# Patient Record
Sex: Female | Born: 1940 | Race: White | Hispanic: No | Smoking: Never smoker
Health system: Southern US, Community
[De-identification: ages and names within clinical notes are randomized; demographics above are authoritative.]

## PROBLEM LIST (undated history)

## (undated) DIAGNOSIS — N950 Postmenopausal bleeding: Secondary | ICD-10-CM

## (undated) DIAGNOSIS — S4290XA Fracture of unspecified shoulder girdle, part unspecified, initial encounter for closed fracture: Secondary | ICD-10-CM

## (undated) DIAGNOSIS — N952 Postmenopausal atrophic vaginitis: Secondary | ICD-10-CM

## (undated) DIAGNOSIS — M1990 Unspecified osteoarthritis, unspecified site: Secondary | ICD-10-CM

## (undated) DIAGNOSIS — M8580 Other specified disorders of bone density and structure, unspecified site: Secondary | ICD-10-CM

## (undated) HISTORY — DX: Postmenopausal bleeding: N95.0

## (undated) HISTORY — PX: OTHER SURGICAL HISTORY: SHX169

## (undated) HISTORY — PX: HYSTEROSCOPY: SHX211

## (undated) HISTORY — PX: ANKLE SURGERY: SHX546

## (undated) HISTORY — DX: Fracture of unspecified shoulder girdle, part unspecified, initial encounter for closed fracture: S42.90XA

## (undated) HISTORY — DX: Unspecified osteoarthritis, unspecified site: M19.90

## (undated) HISTORY — DX: Other specified disorders of bone density and structure, unspecified site: M85.80

## (undated) HISTORY — DX: Postmenopausal atrophic vaginitis: N95.2

## (undated) HISTORY — PX: HAND SURGERY: SHX662

---

## 1983-07-27 HISTORY — PX: BREAST BIOPSY: SHX20

## 1998-12-04 ENCOUNTER — Other Ambulatory Visit: Admission: RE | Admit: 1998-12-04 | Discharge: 1998-12-04 | Payer: Self-pay | Admitting: Obstetrics and Gynecology

## 1999-06-15 ENCOUNTER — Emergency Department (HOSPITAL_COMMUNITY): Admission: EM | Admit: 1999-06-15 | Discharge: 1999-06-15 | Payer: Self-pay | Admitting: Emergency Medicine

## 1999-10-27 ENCOUNTER — Encounter: Payer: Self-pay | Admitting: Obstetrics and Gynecology

## 1999-10-27 ENCOUNTER — Encounter: Admission: RE | Admit: 1999-10-27 | Discharge: 1999-10-27 | Payer: Self-pay | Admitting: Obstetrics and Gynecology

## 2000-01-15 ENCOUNTER — Other Ambulatory Visit: Admission: RE | Admit: 2000-01-15 | Discharge: 2000-01-15 | Payer: Self-pay | Admitting: Obstetrics and Gynecology

## 2000-10-31 ENCOUNTER — Encounter: Payer: Self-pay | Admitting: Obstetrics and Gynecology

## 2000-10-31 ENCOUNTER — Encounter: Admission: RE | Admit: 2000-10-31 | Discharge: 2000-10-31 | Payer: Self-pay | Admitting: Obstetrics and Gynecology

## 2001-01-16 ENCOUNTER — Other Ambulatory Visit: Admission: RE | Admit: 2001-01-16 | Discharge: 2001-01-16 | Payer: Self-pay | Admitting: Obstetrics and Gynecology

## 2001-05-19 ENCOUNTER — Ambulatory Visit (HOSPITAL_COMMUNITY): Admission: RE | Admit: 2001-05-19 | Discharge: 2001-05-19 | Payer: Self-pay | Admitting: Obstetrics and Gynecology

## 2001-05-19 ENCOUNTER — Encounter (INDEPENDENT_AMBULATORY_CARE_PROVIDER_SITE_OTHER): Payer: Self-pay | Admitting: Specialist

## 2001-11-01 ENCOUNTER — Encounter: Payer: Self-pay | Admitting: Obstetrics and Gynecology

## 2001-11-01 ENCOUNTER — Encounter: Admission: RE | Admit: 2001-11-01 | Discharge: 2001-11-01 | Payer: Self-pay | Admitting: Obstetrics and Gynecology

## 2002-06-29 ENCOUNTER — Other Ambulatory Visit: Admission: RE | Admit: 2002-06-29 | Discharge: 2002-06-29 | Payer: Self-pay | Admitting: Obstetrics and Gynecology

## 2002-11-23 ENCOUNTER — Encounter: Payer: Self-pay | Admitting: Obstetrics and Gynecology

## 2002-11-23 ENCOUNTER — Encounter: Admission: RE | Admit: 2002-11-23 | Discharge: 2002-11-23 | Payer: Self-pay | Admitting: Obstetrics and Gynecology

## 2003-10-10 ENCOUNTER — Encounter: Admission: RE | Admit: 2003-10-10 | Discharge: 2003-10-10 | Payer: Self-pay | Admitting: Gastroenterology

## 2004-01-09 ENCOUNTER — Encounter: Admission: RE | Admit: 2004-01-09 | Discharge: 2004-01-09 | Payer: Self-pay | Admitting: Obstetrics and Gynecology

## 2004-01-15 ENCOUNTER — Other Ambulatory Visit: Admission: RE | Admit: 2004-01-15 | Discharge: 2004-01-15 | Payer: Self-pay | Admitting: Obstetrics and Gynecology

## 2004-05-20 ENCOUNTER — Ambulatory Visit (HOSPITAL_COMMUNITY): Admission: RE | Admit: 2004-05-20 | Discharge: 2004-05-20 | Payer: Self-pay | Admitting: Orthopedic Surgery

## 2004-05-20 ENCOUNTER — Ambulatory Visit (HOSPITAL_BASED_OUTPATIENT_CLINIC_OR_DEPARTMENT_OTHER): Admission: RE | Admit: 2004-05-20 | Discharge: 2004-05-20 | Payer: Self-pay | Admitting: Orthopedic Surgery

## 2005-01-12 ENCOUNTER — Encounter: Admission: RE | Admit: 2005-01-12 | Discharge: 2005-01-12 | Payer: Self-pay | Admitting: Obstetrics and Gynecology

## 2005-01-19 ENCOUNTER — Other Ambulatory Visit: Admission: RE | Admit: 2005-01-19 | Discharge: 2005-01-19 | Payer: Self-pay | Admitting: Obstetrics and Gynecology

## 2005-09-01 ENCOUNTER — Observation Stay (HOSPITAL_COMMUNITY): Admission: EM | Admit: 2005-09-01 | Discharge: 2005-09-02 | Payer: Self-pay | Admitting: Emergency Medicine

## 2005-11-24 ENCOUNTER — Encounter: Payer: Self-pay | Admitting: Emergency Medicine

## 2006-01-17 ENCOUNTER — Encounter: Admission: RE | Admit: 2006-01-17 | Discharge: 2006-01-17 | Payer: Self-pay | Admitting: Obstetrics and Gynecology

## 2006-01-20 ENCOUNTER — Other Ambulatory Visit: Admission: RE | Admit: 2006-01-20 | Discharge: 2006-01-20 | Payer: Self-pay | Admitting: Obstetrics and Gynecology

## 2007-01-19 ENCOUNTER — Encounter: Admission: RE | Admit: 2007-01-19 | Discharge: 2007-01-19 | Payer: Self-pay | Admitting: Obstetrics and Gynecology

## 2007-02-14 ENCOUNTER — Other Ambulatory Visit: Admission: RE | Admit: 2007-02-14 | Discharge: 2007-02-14 | Payer: Self-pay | Admitting: Obstetrics and Gynecology

## 2007-02-14 ENCOUNTER — Encounter: Admission: RE | Admit: 2007-02-14 | Discharge: 2007-02-14 | Payer: Self-pay | Admitting: Gastroenterology

## 2007-07-13 ENCOUNTER — Ambulatory Visit (HOSPITAL_BASED_OUTPATIENT_CLINIC_OR_DEPARTMENT_OTHER): Admission: RE | Admit: 2007-07-13 | Discharge: 2007-07-13 | Payer: Self-pay | Admitting: Orthopedic Surgery

## 2007-07-27 HISTORY — PX: CATARACT EXTRACTION: SUR2

## 2007-07-27 HISTORY — PX: EYE SURGERY: SHX253

## 2008-01-29 ENCOUNTER — Encounter: Admission: RE | Admit: 2008-01-29 | Discharge: 2008-01-29 | Payer: Self-pay | Admitting: Obstetrics and Gynecology

## 2009-01-31 ENCOUNTER — Encounter: Admission: RE | Admit: 2009-01-31 | Discharge: 2009-01-31 | Payer: Self-pay | Admitting: Obstetrics and Gynecology

## 2009-02-17 ENCOUNTER — Ambulatory Visit: Payer: Self-pay | Admitting: Obstetrics and Gynecology

## 2009-02-20 ENCOUNTER — Ambulatory Visit: Payer: Self-pay | Admitting: Obstetrics and Gynecology

## 2009-02-20 ENCOUNTER — Encounter: Payer: Self-pay | Admitting: Obstetrics and Gynecology

## 2009-02-20 ENCOUNTER — Other Ambulatory Visit: Admission: RE | Admit: 2009-02-20 | Discharge: 2009-02-20 | Payer: Self-pay | Admitting: Obstetrics and Gynecology

## 2009-07-26 HISTORY — PX: TOTAL ANKLE REPLACEMENT: SUR1218

## 2010-01-06 ENCOUNTER — Ambulatory Visit (HOSPITAL_COMMUNITY): Admission: RE | Admit: 2010-01-06 | Discharge: 2010-01-07 | Payer: Self-pay | Admitting: Orthopedic Surgery

## 2010-03-05 ENCOUNTER — Encounter: Admission: RE | Admit: 2010-03-05 | Discharge: 2010-03-05 | Payer: Self-pay | Admitting: Obstetrics and Gynecology

## 2010-03-17 ENCOUNTER — Other Ambulatory Visit: Admission: RE | Admit: 2010-03-17 | Discharge: 2010-03-17 | Payer: Self-pay | Admitting: Obstetrics and Gynecology

## 2010-03-17 ENCOUNTER — Ambulatory Visit: Payer: Self-pay | Admitting: Obstetrics and Gynecology

## 2010-08-16 ENCOUNTER — Encounter: Payer: Self-pay | Admitting: Obstetrics and Gynecology

## 2010-10-11 LAB — PROTIME-INR: INR: 1.1 (ref 0.00–1.49)

## 2010-10-12 LAB — CBC
MCHC: 34.6 g/dL (ref 30.0–36.0)
MCV: 90.4 fL (ref 78.0–100.0)
WBC: 5.8 10*3/uL (ref 4.0–10.5)

## 2010-12-08 NOTE — Op Note (Signed)
NAMEKASEY, HANSELL               ACCOUNT NO.:  [id number]   MEDICAL RECORD NO.:  [id number]          PATIENT TYPE:  AMB   LOCATION:  DSC                          FACILITY:  [facility]   PHYSICIAN:  Rodney A. Mortenson, M.D.DATE OF BIRTH:  Apr 04, 1941   DATE OF PROCEDURE:  07/13/2007  DATE OF DISCHARGE:                               OPERATIVE REPORT   PREOPERATIVE DIAGNOSIS:  Painful hardware left ankle, secondary to old  open reduction and internal fixation bimalleolar fracture left ankle.   POSTOP DIAGNOSIS:  Painful hardware left ankle, secondary to old open  reduction and internal fixation bimalleolar fracture left ankle.   OPERATION:  Removal of screws from medial malleolus, left ankle.   SURGEON:  Lenard Galloway. Chaney Malling, M.D.   ANESTHESIA:  General.   PROCEDURE:  The patient placed on the operating table in the supine  position with the pneumatic tourniquet brought about the left upper  thigh.  The entire left lower extremity was prepped with DuraPrep, and  draped out in the usual manner.  The leg was wrapped out with Esmarch.  Tourniquet was not used initially.   Incision made over the medial malleolus, and the area of the previous  incision.  C-arm was used throughout to localize the two screws.  Dissection carried down to the medial malleolus.  Needles were inserted,  and the C-arm was used extensively.  The first screw was identified and  isolated.  Soft tissue was stripped off.  There was some hypertrophic  bone around the screw, and the screw was removed, and the excess bony  protuberance was debrided with a rongeur, and it was smoothed off very  nicely.  Bleeders were coagulated throughout procedure.   In a similar fashion, attention was turned to the second screw which was  more proximal and posterior.  Needles were used to localize the site of  the screw using the C-arm.  This screw was more proximal and posterior,  and dissection was carried down to the area of the  posterior tibial  tendon.  The screw was identified and isolated.  It appears as though  this was very close to the posterior tibial tendon, and it may have been  causing some slight discomfort; but the tendon, itself, appeared normal.  Some of bony hypertrophy was debrided in this area so there was access  to the screw.  The screw was then removed.   At this point, the ankle was irrigated with copious amounts of saline  solution.  During the procedure, the Esmarch became ineffective and  tourniquet was used.  At this point, the tourniquet was dropped and  bleeders were coagulated.  One vein was tied off with 2-0 Vicryl.  There  was excellent motion of the ankle and subtalar joint.  Skin was then  closed with interrupted stainless steel staples.  Marcaine was placed  about the wound.  A large bulky pressure dressing was applied, and the  patient returned to the recovery room in excellent condition.  The  techniques went extremely well.  It should be noted, there was  hypertrophic bone above the  screw heads both anteriorly and posteriorly,  and this may have been causing the pain and discomfort she was  experiencing.   DISPOSITION:  1. To my office Monday.  2. Vicodin for pain.  3. Touchdown weightbearing with crutches.  4. Usual postop instructions were given.      Rodney A. Chaney Malling, M.D.  Electronically Signed     RAM/MEDQ  D:  07/13/2007  T:  07/14/2007  Job:  [id number]

## 2010-12-11 NOTE — H&P (Signed)
NAMEMIRIAM, Courtney Nash               ACCOUNT NO.:  [id number]   MEDICAL RECORD NO.:  [id number]          PATIENT TYPE:  AMB   LOCATION:  DSC                          FACILITY:  [facility]   PHYSICIAN:  Artist Pais. Weingold, M.D.DATE OF BIRTH:  Feb 15, 1941   DATE OF ADMISSION:  05/20/2004  DATE OF DISCHARGE:                                HISTORY & PHYSICAL   PREOPERATIVE DIAGNOSIS:  Right thumb stenosing tenosynovitis.   POSTOPERATIVE DIAGNOSIS:  Right thumb stenosing tenosynovitis.   PROCEDURE:  Right thumb A1 pulley release.   SURGEON:  Dr. Mina Marble.   ASSISTANT:  Nurse.   ANESTHESIA:  Monitored anesthesia care with 2% plain lidocaine, 0.25%  Marcaine digitally to achieve block.   TOURNIQUET TIME:  12 minutes.   COMPLICATIONS:  None.   DRAINS:  None.   OPERATIVE REPORT:  The patient was taken to the operating room after the  induction of adequate IV sedation.  The right upper extremity was prepped  and draped in the usual sterile fashion.  An Esmarch was used to  exsanguinate the limb.  The tourniquet was inflated to 250 mmHg.  At this  point in time, 3 cc of a combination of 0.25% plain Marcaine and 2%  lidocaine was injected in the A1 pulley area of the right thumb.  Once  anesthesia was obtained, a transverse incision was made.  The neurovascular  bundles were identified and retracted.  The A1 pulley was split with a 15  blade.  The FPL tendon was lysed of all adhesions.  It was irrigated and  loosely closed with 5-0 nylon.  A sterile dressing, Xeroform, 4 x 4's and a  compressive bandage was applied.  The patient tolerated the procedure well  and went to the recovery room in stable fashion.       MAW/MEDQ  D:  05/20/2004  T:  05/20/2004  Job:  [id number]

## 2010-12-11 NOTE — Op Note (Signed)
[facility]  Patient:    Courtney Nash, Courtney Nash Visit Number: [id number] MRN: [id number]          Service Type: Attending:  Rande Brunt. Eda Paschal, M.D. Proc. Date: 05/19/01                             Operative Report  PREOPERATIVE DIAGNOSES:  Postmenopausal bleeding with enlarged endometrial cavity.  POSTOPERATIVE DIAGNOSES:  Postmenopausal bleeding with enlarged endometrial cavity.  OPERATION:  Dilation and curettage hysteroscopy.  SURGEON:  Daniel L. Eda Paschal, M.D.  ANESTHESIA:  General.  INDICATIONS:  The patient is a 70 year old female, who had inappropriate postmenopausal bleeding.  Ultrasound was done in the office which showed an enlarged endometrial cavity with an enlarged endometrial stripe without specific pathology, but it was enlarged, and there was some fluid in the cavity.  Because of a stenotic cervix and the patients reluctance to have sampling done in the office because of the discomfort associated with it, she is brought to the operating room for hysteroscopy D&C so that proper endometrial sampling can be obtained, and a hysteroscopic examination can be done looking for intrauterine pathology.  FINDINGS:  External and vaginal is within normal limits.  Cervix is clean but stenotic.  Uterus is anteverted and normal size and shape.  Adnexa are not palpable.  There are no adnexal masses.  At the time of hysteroscopy, the patient had a normal endometrial cavity.  It was atrophic without any evidence of pathology whatsoever.  When endometrial sampling was obtained, almost no tissue could be obtained because the lining was atrophic.  DESCRIPTION OF PROCEDURE:  After adequate general endotracheal anesthesia, the patient was placed in a dorsal supine position and prepped and draped in the usual sterile manner.  A single-tooth tenaculum was placed in the anterior lip of the cervix.  Taking extreme care to dilate her because of the  atrophic condition, she was uneventfully dilated to a #23 Pratt dilator and then a hysteroscopic examination was done with the observatory hysteroscope.  It was attached to a camera for magnification and 3% sorbitol was used to expand the intrauterine cavity.  When the uterus was entered, although care had been taken with the dilatation, she had a very small perforation at the top of the fundus in the midline that was not bleeding.  The hysteroscope could be, however, placed at the top of the cervix.  The intrauterine cavity could be expanded so that the entire intrauterine cavity could be observed, and there was no pathology noted.  It was also safe to take endometrial samplings without concern that the curette was leaving the uterus.  At the termination of the procedure, there was no bleeding noted.   She was re-hysteroscoped. The perforation actually had closed.  Fluid deficit was only 280 cc.  The patient tolerated the procedure well and left the operating room in satisfactory condition. Attending:  Rande Brunt. Eda Paschal, M.D. DD:  05/19/01 TD:  05/20/01 Job: [id number]

## 2010-12-11 NOTE — Op Note (Signed)
Courtney Nash, Courtney Nash               ACCOUNT NO.:  [id number]   MEDICAL RECORD NO.:  [id number]          PATIENT TYPE:  INP   LOCATION:  5030                         FACILITY:  [facility]   PHYSICIAN:  Lenard Galloway. Mortenson, M.D.DATE OF BIRTH:  05/24/1941   DATE OF PROCEDURE:  09/01/2005  DATE OF DISCHARGE:                                 OPERATIVE REPORT   PREOPERATIVE DIAGNOSIS:  Comminuted fracture, left fibula; comminuted two-  part fracture medial malleolus, left ankle.   POSTOPERATIVE DIAGNOSIS:  Comminuted fracture, left fibula; comminuted two-  part fracture medial malleolus, left ankle.   OPERATION PERFORMED:  Open reduction fixation using 7-hole lateral side  plate on fibula; two cannulated screws two-part fracture medial malleolus,  left ankle.   SURGEON:  Lenard Galloway. Chaney Malling, M.D.   ASSISTANT:  Richardean Canal, P.A.   ANESTHESIA:  General.   DESCRIPTION OF PROCEDURE:  Patient placed on the operating table in supine  position with a pneumatic tourniquet about the left upper thigh.  Left lower  extremity was prepped with DuraPrep and draped out in the usual manner.  The  leg was wrapped out with an Esmarch.  The tourniquet was elevated.  Incision  made over the fibula.  This was carried proximal to the fracture and down to  the tip of the lateral malleolus.  Skin edges were retracted.  Incision was  carried down to the bony elements.  Care was taken to avoid injury to the  superficial cutaneous nerves.  The fracture was seen.  There was a  comminuted fracture part with a large butterfly fragment posteriorly.  Blood  was removed and wound was irrigated.  The fracture was reduced almost  anatomically and butterfly fragments put back in an anatomic position.  A 7-  hole lateral side plate was selected and placed over the fracture and held  in place with clamps.  This reduced the fracture anatomically on the lateral  side.  Drill holes were made and measured and appropriate length  cortical  screws were used.  Distally locking plate fixation screws were used.  An  excellent reduction was achieved about the fibula.  Attention was turned to  the medial malleolus.  An incision was made proximal to the fracture site  and carried down to the tip of the medial malleolus.  Skin edges were  retracted.  The fracture was interesting in that it was a T-type fracture  with two parts to the medial malleolus and anterior and posterior fragment.  Some loose pieces of bone were then removed.  Each fragment was then reduced  into an anatomic position. Fixation pins were inserted.  Guide pins were  passed up through the anterior fragment, posterior fragment, checked with  the C-arm.  The C-arm was used throughout the entire operative procedure and  reduction and fixation with screws.  The screw length was measured and  appropriate length cancellous screws were passed  over the guide pins and  both anterior and posterior malleolar fragments were held in excellent  position and stabilized very nicely.  Wound was irrigated with saline  solution.  Vicryl was used to close the subcutaneous tissue.  Stainless  steel staples used to close the skin.  Sterile dressings were applied and a  sugar tong cast applied.  The patient returned to the recovery room in  excellent condition. Technically, this procedure went extremely well.  The  patient is to be nonweightbearing.   DRAINS:  None.   COMPLICATIONS:  None.   I was very pleased with the surgical outcome.           ______________________________  Lenard Galloway. Chaney Malling, M.D.     RAM/MEDQ  D:  09/01/2005  T:  09/02/2005  Job:  [id number]

## 2011-01-20 ENCOUNTER — Other Ambulatory Visit: Payer: Self-pay | Admitting: Obstetrics and Gynecology

## 2011-01-20 DIAGNOSIS — Z1231 Encounter for screening mammogram for malignant neoplasm of breast: Secondary | ICD-10-CM

## 2011-03-08 ENCOUNTER — Ambulatory Visit
Admission: RE | Admit: 2011-03-08 | Discharge: 2011-03-08 | Disposition: A | Discharge status: Home / Self Care | Payer: PRIVATE HEALTH INSURANCE | Source: Ambulatory Visit | Attending: Obstetrics and Gynecology | Admitting: Obstetrics and Gynecology

## 2011-03-08 DIAGNOSIS — Z1231 Encounter for screening mammogram for malignant neoplasm of breast: Secondary | ICD-10-CM

## 2011-03-19 ENCOUNTER — Encounter: Payer: Self-pay | Admitting: Obstetrics and Gynecology

## 2011-03-25 ENCOUNTER — Ambulatory Visit (INDEPENDENT_AMBULATORY_CARE_PROVIDER_SITE_OTHER): Payer: PRIVATE HEALTH INSURANCE | Admitting: Gynecology

## 2011-03-25 ENCOUNTER — Other Ambulatory Visit: Payer: Self-pay

## 2011-03-25 DIAGNOSIS — M899 Disorder of bone, unspecified: Secondary | ICD-10-CM

## 2011-03-25 DIAGNOSIS — M949 Disorder of cartilage, unspecified: Secondary | ICD-10-CM

## 2011-04-05 ENCOUNTER — Encounter: Payer: Self-pay | Admitting: Obstetrics and Gynecology

## 2011-04-05 ENCOUNTER — Ambulatory Visit (INDEPENDENT_AMBULATORY_CARE_PROVIDER_SITE_OTHER): Payer: PRIVATE HEALTH INSURANCE | Admitting: Obstetrics and Gynecology

## 2011-04-05 VITALS — BP 120/64 | Ht 66.0 in | Wt 162.0 lb

## 2011-04-05 DIAGNOSIS — R82998 Other abnormal findings in urine: Secondary | ICD-10-CM

## 2011-04-05 DIAGNOSIS — R350 Frequency of micturition: Secondary | ICD-10-CM

## 2011-04-05 DIAGNOSIS — R3915 Urgency of urination: Secondary | ICD-10-CM

## 2011-04-05 DIAGNOSIS — M8580 Other specified disorders of bone density and structure, unspecified site: Secondary | ICD-10-CM | POA: Insufficient documentation

## 2011-04-05 DIAGNOSIS — M949 Disorder of cartilage, unspecified: Secondary | ICD-10-CM

## 2011-04-05 DIAGNOSIS — N950 Postmenopausal bleeding: Secondary | ICD-10-CM | POA: Insufficient documentation

## 2011-04-05 DIAGNOSIS — N952 Postmenopausal atrophic vaginitis: Secondary | ICD-10-CM

## 2011-04-05 NOTE — Progress Notes (Signed)
Subjective:     Patient ID: Courtney Nash, female   DOB: 12/18/1940, 70 y.o.   MRN: [id number]  HPIpatient came back to see me today for followup. We first discussed her bone density. She does have osteopenia with one hip getting thinner. She is however a low threshold for treatment. She had a bad  fall this year and did not fracture anything. She is taking her calcium and D. Her hot flashes have resolved. She does have vaginal dryness but is not having intercourse. She is having no vaginal bleeding. She does have nocturia 2-3 times per night as well as some urgency. She does not feel it's at that point to take either medication or vaginal estrogen.   Review of Systems  Constitutional: Negative.   Eyes: Negative.   Respiratory: Negative.   Cardiovascular: Negative.   Gastrointestinal:       Gerd  Genitourinary: Positive for urgency and frequency.  Musculoskeletal: Negative.   Neurological: Positive for headaches.  Hematological: Negative.   Psychiatric/Behavioral: Negative.        Objective:   Physical ExamPhysical examination: HEENT within normal limits. Neck: Thyroid not large. No masses. Supraclavicular nodes: not enlarged. Breasts: Examined in both sitting midline position. No skin changes and no masses. Abdomen: Soft no guarding rebound or masses or hernia. Pelvic: External: Within normal limits. BUS: Within normal limits. Vaginal:within normal limits. poor estrogen effect. No evidence of  rectocele or enterocele small cystocele. Cervix: clean. Uterus: Normal size and shape. Adnexa: No masses. Rectovaginal exam: Confirmatory and negative. Extremities: Within normal limits.     Assessment:     1. Atrophic vaginitis 2. Osteopenia 3. Cystocele 4. Urgency and frequency    Plan:    discussed treatment for all the above. For the moment we'll just watch expectantly.

## 2011-04-05 NOTE — Progress Notes (Signed)
Addended byCammie Mcgee T on: 04/05/2011 11:38 AM   Modules accepted: Orders

## 2011-04-13 ENCOUNTER — Other Ambulatory Visit: Payer: Self-pay

## 2011-04-13 DIAGNOSIS — N390 Urinary tract infection, site not specified: Secondary | ICD-10-CM

## 2011-04-13 MED ORDER — NITROFURANTOIN MONOHYD MACRO 100 MG PO CAPS: 100.0000 mg | ORAL_CAPSULE | Freq: Two times a day (BID) | ORAL | Status: AC

## 2011-04-14 ENCOUNTER — Encounter: Payer: Self-pay | Admitting: Obstetrics and Gynecology

## 2011-04-30 LAB — POCT HEMOGLOBIN-HEMACUE
Hemoglobin: 13.3
Operator id: 123881

## 2011-05-04 ENCOUNTER — Ambulatory Visit (INDEPENDENT_AMBULATORY_CARE_PROVIDER_SITE_OTHER): Payer: PRIVATE HEALTH INSURANCE | Admitting: *Deleted

## 2011-05-04 DIAGNOSIS — N390 Urinary tract infection, site not specified: Secondary | ICD-10-CM

## 2011-05-05 MED ORDER — CIPROFLOXACIN HCL 250 MG PO TABS: 250.0000 mg | ORAL_TABLET | Freq: Two times a day (BID) | ORAL | Status: AC

## 2011-05-18 ENCOUNTER — Other Ambulatory Visit: Payer: PRIVATE HEALTH INSURANCE | Admitting: *Deleted

## 2011-05-18 DIAGNOSIS — N390 Urinary tract infection, site not specified: Secondary | ICD-10-CM

## 2011-11-15 ENCOUNTER — Other Ambulatory Visit: Payer: Self-pay | Admitting: Gastroenterology

## 2011-12-27 ENCOUNTER — Ambulatory Visit (INDEPENDENT_AMBULATORY_CARE_PROVIDER_SITE_OTHER): Payer: PRIVATE HEALTH INSURANCE | Admitting: Obstetrics and Gynecology

## 2011-12-27 DIAGNOSIS — Z780 Asymptomatic menopausal state: Secondary | ICD-10-CM

## 2011-12-27 DIAGNOSIS — R300 Dysuria: Secondary | ICD-10-CM

## 2011-12-27 LAB — URINALYSIS W MICROSCOPIC + REFLEX CULTURE
Casts: NONE SEEN
Glucose, UA: 250 mg/dL — AB
Protein, ur: >300 mg/dL — AB
pH: 5 (ref 5.0–8.0)

## 2011-12-27 MED ORDER — NITROFURANTOIN MONOHYD MACRO 100 MG PO CAPS: 100.0000 mg | ORAL_CAPSULE | Freq: Two times a day (BID) | ORAL | Status: AC

## 2011-12-27 NOTE — Progress Notes (Signed)
Patient came to see me today with a five-day history of dysuria, frequency, urgency, inability to completely empty her bladder. She has been using Azo-Standard with partial relief. Her urinalysis today was abnormal. She also wanted to discuss that her internist switched her from Serzone to Depakote for treatment of her depression because he said that Serzone can cause liver damage. So far her liver function has been normal. She says her headaches are better on the Depakote but she is now having more hot flashes.  Assessment: #1. Urinary tract infection #2. Increasing hot flashes  Plan: Macrobid twice a day with food for 7 days. Followup urinalysis next week. Discussed her considering going back on Serzone. She will discuss with PCP. I told her I would give her estrogen although we tried to avoid starting it in someone at 36. We also discussed Effexor and Pristiq.

## 2011-12-30 LAB — URINE CULTURE: Colony Count: >=100000

## 2012-01-04 ENCOUNTER — Other Ambulatory Visit: Payer: Self-pay | Admitting: Obstetrics and Gynecology

## 2012-01-04 ENCOUNTER — Ambulatory Visit (INDEPENDENT_AMBULATORY_CARE_PROVIDER_SITE_OTHER): Payer: PRIVATE HEALTH INSURANCE | Admitting: Obstetrics and Gynecology

## 2012-01-04 DIAGNOSIS — R300 Dysuria: Secondary | ICD-10-CM

## 2012-01-04 DIAGNOSIS — R3129 Other microscopic hematuria: Secondary | ICD-10-CM

## 2012-01-04 LAB — URINALYSIS W MICROSCOPIC + REFLEX CULTURE
Bilirubin Urine: NEGATIVE
Glucose, UA: NEGATIVE mg/dL
Ketones, ur: NEGATIVE mg/dL
Protein, ur: NEGATIVE mg/dL
Urobilinogen, UA: 0.2 mg/dL (ref 0.0–1.0)

## 2012-01-04 NOTE — Patient Instructions (Signed)
Discuss with Dr. Neale Burly either Pristiq or effexor for hot flashes.

## 2012-01-04 NOTE — Progress Notes (Signed)
Patient came back today for a followup visit after being treated for urinary tract infection last week. Her culture grew out both Klebsiella and Escherichia coli. They were both sensitive to Macrodantin although one showed intermediate sensitivity. She is completely asymptomatic today after finishing her antibiotic. On urinalysis she still has 3-6 red blood cells.  Assessment: #1. Urinary tract infection #2. Microscopic hematuria #3. Hot flashes  Plan: We will culture her urine. We will obviously treat any infection. If there is no infection she will return in 3 weeks for followup urinalysis due to red blood cells. She has never been evaluated for microscopic hematuria. She will discuss SSRIs for hot flashes with Dr. Neale Burly.

## 2012-01-06 ENCOUNTER — Other Ambulatory Visit: Payer: Self-pay | Admitting: *Deleted

## 2012-01-06 DIAGNOSIS — R718 Other abnormality of red blood cells: Secondary | ICD-10-CM

## 2012-01-06 LAB — URINE CULTURE: Organism ID, Bacteria: NO GROWTH

## 2012-01-28 ENCOUNTER — Other Ambulatory Visit: Payer: PRIVATE HEALTH INSURANCE

## 2012-01-28 DIAGNOSIS — R718 Other abnormality of red blood cells: Secondary | ICD-10-CM

## 2012-01-29 LAB — URINALYSIS W MICROSCOPIC + REFLEX CULTURE
Casts: NONE SEEN
Hgb urine dipstick: NEGATIVE
Ketones, ur: NEGATIVE mg/dL
Nitrite: NEGATIVE
Protein, ur: NEGATIVE mg/dL
pH: 7.5 (ref 5.0–8.0)

## 2012-01-31 LAB — URINE CULTURE: Colony Count: >=100000

## 2012-02-01 ENCOUNTER — Telehealth: Payer: Self-pay | Admitting: Obstetrics and Gynecology

## 2012-02-01 DIAGNOSIS — N390 Urinary tract infection, site not specified: Secondary | ICD-10-CM

## 2012-02-01 MED ORDER — TERCONAZOLE 0.8 % VA CREA: 1.0000 | TOPICAL_CREAM | Freq: Every day | VAGINAL | Status: AC

## 2012-02-01 MED ORDER — CIPROFLOXACIN HCL 500 MG PO TABS: 500.0000 mg | ORAL_TABLET | Freq: Two times a day (BID) | ORAL | Status: AC

## 2012-02-01 NOTE — Telephone Encounter (Signed)
Patient's saw me in June with a urinary tract infection. She was treated with Macrobid and urine culture became negative. However on both the first and second urines there was microscopic hematuria. We asked her to return again for followup urine with urological referral if hematuria persisted. This urine did not show hematuria but she now had another infection with greater than 100,000 colonies of Escherichia coli. I called her today and discussed  This. She was treated with Cipro 500 mg twice a day for 7 days and then she will return for followup urinalysis. I also called her in  terconazole 3 cream because she is now itching vaginally.

## 2012-02-04 ENCOUNTER — Other Ambulatory Visit: Payer: Self-pay | Admitting: Obstetrics and Gynecology

## 2012-02-04 DIAGNOSIS — Z1231 Encounter for screening mammogram for malignant neoplasm of breast: Secondary | ICD-10-CM

## 2012-02-10 ENCOUNTER — Other Ambulatory Visit: Payer: PRIVATE HEALTH INSURANCE

## 2012-02-10 DIAGNOSIS — N390 Urinary tract infection, site not specified: Secondary | ICD-10-CM

## 2012-02-11 LAB — URINALYSIS W MICROSCOPIC + REFLEX CULTURE
Bacteria, UA: NONE SEEN
Hgb urine dipstick: NEGATIVE
Ketones, ur: NEGATIVE mg/dL
Leukocytes, UA: NEGATIVE
Nitrite: NEGATIVE
Specific Gravity, Urine: 1.016 (ref 1.005–1.030)
Urobilinogen, UA: 0.2 mg/dL (ref 0.0–1.0)

## 2012-02-16 ENCOUNTER — Telehealth: Payer: Self-pay | Admitting: *Deleted

## 2012-02-16 NOTE — Telephone Encounter (Signed)
PT INFORMED WITH NEGATIVE RECENT 02/10/12  URINE CULTURE RESULTS.

## 2012-03-09 ENCOUNTER — Ambulatory Visit
Admission: RE | Admit: 2012-03-09 | Discharge: 2012-03-09 | Disposition: A | Discharge status: Home / Self Care | Payer: PRIVATE HEALTH INSURANCE | Source: Ambulatory Visit | Attending: Obstetrics and Gynecology | Admitting: Obstetrics and Gynecology

## 2012-03-09 DIAGNOSIS — Z1231 Encounter for screening mammogram for malignant neoplasm of breast: Secondary | ICD-10-CM

## 2012-04-05 ENCOUNTER — Encounter: Payer: PRIVATE HEALTH INSURANCE | Admitting: Obstetrics and Gynecology

## 2012-04-11 ENCOUNTER — Ambulatory Visit (INDEPENDENT_AMBULATORY_CARE_PROVIDER_SITE_OTHER): Payer: PRIVATE HEALTH INSURANCE | Admitting: Obstetrics and Gynecology

## 2012-04-11 ENCOUNTER — Encounter: Payer: Self-pay | Admitting: Obstetrics and Gynecology

## 2012-04-11 VITALS — BP 140/82 | Ht 66.0 in | Wt 173.0 lb

## 2012-04-11 DIAGNOSIS — IMO0002 Reserved for concepts with insufficient information to code with codable children: Secondary | ICD-10-CM

## 2012-04-11 DIAGNOSIS — Z780 Asymptomatic menopausal state: Secondary | ICD-10-CM

## 2012-04-11 DIAGNOSIS — M899 Disorder of bone, unspecified: Secondary | ICD-10-CM

## 2012-04-11 DIAGNOSIS — M8580 Other specified disorders of bone density and structure, unspecified site: Secondary | ICD-10-CM

## 2012-04-11 DIAGNOSIS — R351 Nocturia: Secondary | ICD-10-CM

## 2012-04-11 DIAGNOSIS — N952 Postmenopausal atrophic vaginitis: Secondary | ICD-10-CM

## 2012-04-11 DIAGNOSIS — N8111 Cystocele, midline: Secondary | ICD-10-CM

## 2012-04-11 MED ORDER — VENLAFAXINE HCL ER 75 MG PO CP24: 75.0000 mg | ORAL_CAPSULE | Freq: Every day | ORAL | Status: DC

## 2012-04-11 NOTE — Patient Instructions (Signed)
Bone density in 2014. 

## 2012-04-11 NOTE — Progress Notes (Signed)
Patient came to see me today for further follow up. She is having significant hot flashes. We had previously discussed SSRIs and the physician treats her with Depakote for her headaches said was okay for her to take them in addition. We have treated her previously for atrophic vaginitis but she is currently not sexually active and does not need treatment. She is having no vaginal bleeding or pelvic pain. She has a cystocele with nocturia but does not require either surgery or medication. She does have low bone mass without an elevated fracture risk. Her last bone density was 2012. She's had no fractures. She is within normal Pap smears. Her last Pap smear was 2011. She has had her mammogram this year. She is having no urinary incontinence or urgency.  ROS: 12 system review done. Pertinent positives above. Other positives include arthritis and migraines.  Physical examination:Courtney Nash present. HEENT within normal limits. Neck: Thyroid not large. No masses. Supraclavicular nodes: not enlarged. Breasts: Examined in both sitting and lying  position. No skin changes and no masses. Abdomen: Soft no guarding rebound or masses or hernia. Pelvic: External: Within normal limits. BUS: Within normal limits. Vaginal:within normal limits. Poor estrogen effect. First-degree cystocele. No evidence of  rectocele or enterocele. Cervix: clean. Uterus: Normal size and shape. Adnexa: No masses. Rectovaginal exam: Confirmatory and negative. Extremities: Within normal limits.  Assessment: #1. Menopausal symptoms #2. Atrophic vaginitis #3.Osteopenia #4. Nocturia #5. Cystocele.  Plan: Effexor XR 75 mg daily started. Continue yearly mammograms. Bone density in 2014.The new Pap smear guidelines were discussed with the patient. No pap done.

## 2012-04-12 LAB — URINALYSIS W MICROSCOPIC + REFLEX CULTURE
Glucose, UA: NEGATIVE mg/dL
Nitrite: NEGATIVE
Protein, ur: NEGATIVE mg/dL
Squamous Epithelial / LPF: NONE SEEN
Urobilinogen, UA: 0.2 mg/dL (ref 0.0–1.0)

## 2012-04-13 LAB — URINE CULTURE

## 2012-09-09 ENCOUNTER — Other Ambulatory Visit: Payer: Self-pay

## 2013-02-05 ENCOUNTER — Other Ambulatory Visit: Payer: Self-pay

## 2013-02-05 DIAGNOSIS — Z1231 Encounter for screening mammogram for malignant neoplasm of breast: Secondary | ICD-10-CM

## 2013-02-28 ENCOUNTER — Other Ambulatory Visit: Payer: Self-pay

## 2013-03-12 ENCOUNTER — Ambulatory Visit
Admission: RE | Admit: 2013-03-12 | Discharge: 2013-03-12 | Disposition: A | Discharge status: Home / Self Care | Payer: Medicare Other | Source: Ambulatory Visit

## 2013-03-12 DIAGNOSIS — Z1231 Encounter for screening mammogram for malignant neoplasm of breast: Secondary | ICD-10-CM

## 2013-03-15 ENCOUNTER — Other Ambulatory Visit: Payer: Self-pay | Admitting: Women's Health

## 2013-03-15 DIAGNOSIS — R928 Other abnormal and inconclusive findings on diagnostic imaging of breast: Secondary | ICD-10-CM

## 2013-03-29 ENCOUNTER — Ambulatory Visit
Admission: RE | Admit: 2013-03-29 | Discharge: 2013-03-29 | Disposition: A | Discharge status: Home / Self Care | Payer: 59 | Source: Ambulatory Visit | Attending: Women's Health | Admitting: Women's Health

## 2013-03-29 DIAGNOSIS — R928 Other abnormal and inconclusive findings on diagnostic imaging of breast: Secondary | ICD-10-CM

## 2013-04-13 ENCOUNTER — Encounter: Payer: Self-pay | Admitting: Women's Health

## 2013-04-13 ENCOUNTER — Other Ambulatory Visit (HOSPITAL_COMMUNITY)
Admission: RE | Admit: 2013-04-13 | Discharge: 2013-04-13 | Disposition: A | Discharge status: Home / Self Care | Payer: Medicare Other | Source: Ambulatory Visit | Attending: Gynecology | Admitting: Gynecology

## 2013-04-13 ENCOUNTER — Ambulatory Visit (INDEPENDENT_AMBULATORY_CARE_PROVIDER_SITE_OTHER): Payer: Medicare Other | Admitting: Women's Health

## 2013-04-13 VITALS — BP 134/80 | Ht 64.25 in | Wt 173.0 lb

## 2013-04-13 DIAGNOSIS — Z124 Encounter for screening for malignant neoplasm of cervix: Secondary | ICD-10-CM

## 2013-04-13 DIAGNOSIS — R350 Frequency of micturition: Secondary | ICD-10-CM

## 2013-04-13 DIAGNOSIS — M8580 Other specified disorders of bone density and structure, unspecified site: Secondary | ICD-10-CM

## 2013-04-13 DIAGNOSIS — M899 Disorder of bone, unspecified: Secondary | ICD-10-CM

## 2013-04-13 NOTE — Patient Instructions (Signed)
Health Recommendations for Postmenopausal Women Respected and ongoing research has looked at the most common causes of death, disability, and poor quality of life in postmenopausal women. The causes include heart disease, diseases of blood vessels, diabetes, depression, cancer, and bone loss (osteoporosis). Many things can be done to help lower the chances of developing these and other common problems: CARDIOVASCULAR DISEASE Heart Disease: A heart attack is a medical emergency. Know the signs and symptoms of a heart attack. Below are things women can do to reduce their risk for heart disease.   Do not smoke. If you smoke, quit.  Aim for a healthy weight. Being overweight causes many preventable deaths. Eat a healthy and balanced diet and drink an adequate amount of liquids.  Get moving. Make a commitment to be more physically active. Aim for 30 minutes of activity on most, if not all days of the week.  Eat for heart health. Choose a diet that is low in saturated fat and cholesterol and eliminate trans fat. Include whole grains, vegetables, and fruits. Read and understand the labels on food containers before buying.  Know your numbers. Ask your caregiver to check your blood pressure, cholesterol (total, HDL, LDL, triglycerides) and blood glucose. Work with your caregiver on improving your entire clinical picture.  High blood pressure. Limit or stop your table salt intake (try salt substitute and food seasonings). Avoid salty foods and drinks. Read labels on food containers before buying. Eating well and exercising can help control high blood pressure. STROKE  Stroke is a medical emergency. Stroke may be the result of a blood clot in a blood vessel in the brain or by a brain hemorrhage (bleeding). Know the signs and symptoms of a stroke. To lower the risk of developing a stroke:  Avoid fatty foods.  Quit smoking.  Control your diabetes, blood pressure, and irregular heart rate. THROMBOPHLEBITIS  (BLOOD CLOT) OF THE LEG  Becoming overweight and leading a stationary lifestyle may also contribute to developing blood clots. Controlling your diet and exercising will help lower the risk of developing blood clots. CANCER SCREENING  Breast Cancer: Take steps to reduce your risk of breast cancer.  You should practice "breast self-awareness." This means understanding the normal appearance and feel of your breasts and should include breast self-examination. Any changes detected, no matter how small, should be reported to your caregiver.  After age 40, you should have a clinical breast exam (CBE) every year.  Starting at age 40, you should consider having a mammogram (breast X-ray) every year.  If you have a family history of breast cancer, talk to your caregiver about genetic screening.  If you are at high risk for breast cancer, talk to your caregiver about having an MRI and a mammogram every year.  Intestinal or Stomach Cancer: Tests to consider are a rectal exam, fecal occult blood, sigmoidoscopy, and colonoscopy. Women who are high risk may need to be screened at an earlier age and more often.  Cervical Cancer:  Beginning at age 30, you should have a Pap test every 3 years as long as the past 3 Pap tests have been normal.  If you have had past treatment for cervical cancer or a condition that could lead to cancer, you need Pap tests and screening for cancer for at least 20 years after your treatment.  If you had a hysterectomy for a problem that was not cancer or a condition that could lead to cancer, then you no longer need Pap tests.    If you are between ages 65 and 70, and you have had normal Pap tests going back 10 years, you no longer need Pap tests.  If Pap tests have been discontinued, risk factors (such as a new sexual partner) need to be reassessed to determine if screening should be resumed.  Some medical problems can increase the chance of getting cervical cancer. In these  cases, your caregiver may recommend more frequent screening and Pap tests.  Uterine Cancer: If you have vaginal bleeding after reaching menopause, you should notify your caregiver.  Ovarian cancer: Other than yearly pelvic exams, there are no reliable tests available to screen for ovarian cancer at this time except for yearly pelvic exams.  Lung Cancer: Yearly chest X-rays can detect lung cancer and should be done on high risk women, such as cigarette smokers and women with chronic lung disease (emphysema).  Skin Cancer: A complete body skin exam should be done at your yearly examination. Avoid overexposure to the sun and ultraviolet light lamps. Use a strong sun block cream when in the sun. All of these things are important in lowering the risk of skin cancer. MENOPAUSE Menopause Symptoms: Hormone therapy products are effective for treating symptoms associated with menopause:  Moderate to severe hot flashes.  Night sweats.  Mood swings.  Headaches.  Tiredness.  Loss of sex drive.  Insomnia.  Other symptoms. Hormone replacement carries certain risks, especially in older women. Women who use or are thinking about using estrogen or estrogen with progestin treatments should discuss that with their caregiver. Your caregiver will help you understand the benefits and risks. The ideal dose of hormone replacement therapy is not known. The Food and Drug Administration (FDA) has concluded that hormone therapy should be used only at the lowest doses and for the shortest amount of time to reach treatment goals.  OSTEOPOROSIS Protecting Against Bone Loss and Preventing Fracture: If you use hormone therapy for prevention of bone loss (osteoporosis), the risks for bone loss must outweigh the risk of the therapy. Ask your caregiver about other medications known to be safe and effective for preventing bone loss and fractures. To guard against bone loss or fractures, the following is recommended:  If  you are less than age 50, take 1000 mg of calcium and at least 600 mg of Vitamin D per day.  If you are greater than age 50 but less than age 70, take 1200 mg of calcium and at least 600 mg of Vitamin D per day.  If you are greater than age 70, take 1200 mg of calcium and at least 800 mg of Vitamin D per day. Smoking and excessive alcohol intake increases the risk of osteoporosis. Eat foods rich in calcium and vitamin D and do weight bearing exercises several times a week as your caregiver suggests. DIABETES Diabetes Melitus: If you have Type I or Type 2 diabetes, you should keep your blood sugar under control with diet, exercise and recommended medication. Avoid too many sweets, starchy and fatty foods. Being overweight can make control more difficult. COGNITION AND MEMORY Cognition and Memory: Menopausal hormone therapy is not recommended for the prevention of cognitive disorders such as Alzheimer's disease or memory loss.  DEPRESSION  Depression may occur at any age, but is common in elderly women. The reasons may be because of physical, medical, social (loneliness), or financial problems and needs. If you are experiencing depression because of medical problems and control of symptoms, talk to your caregiver about this. Physical activity and   exercise may help with mood and sleep. Community and volunteer involvement may help your sense of value and worth. If you have depression and you feel that the problem is getting worse or becoming severe, talk to your caregiver about treatment options that are best for you. ACCIDENTS  Accidents are common and can be serious in the elderly woman. Prepare your house to prevent accidents. Eliminate throw rugs, place hand bars in the bath, shower and toilet areas. Avoid wearing high heeled shoes or walking on wet, snowy, and icy areas. Limit or stop driving if you have vision or hearing problems, or you feel you are unsteady with you movements and  reflexes. HEPATITIS C Hepatitis C is a type of viral infection affecting the liver. It is spread mainly through contact with blood from an infected person. It can be treated, but if left untreated, it can lead to severe liver damage over years. Many people who are infected do not know that the virus is in their blood. If you are a "baby-boomer", it is recommended that you have one screening test for Hepatitis C. IMMUNIZATIONS  Several immunizations are important to consider having during your senior years, including:   Tetanus, diptheria, and pertussis booster shot.  Influenza every year before the flu season begins.  Pneumonia vaccine.  Shingles vaccine.  Others as indicated based on your specific needs. Talk to your caregiver about these. Document Released: 09/03/2005 Document Revised: 06/28/2012 Document Reviewed: 04/29/2008 Exit[employer] Patient Information 2014 [employer].  

## 2013-04-13 NOTE — Progress Notes (Signed)
Courtney Nash Jan 22, 1941 [id number]    History:    The patient presents for breast and pelvic exam. Occasionally has difficulty starting stream of urination but able to feel completely empty after voiding. History of normal Paps and mammograms benign breast biopsy right breast in the 80s. Negative colonoscopy 2013. Current on vaccines. DEXA 02/2011 T score  -2 left femoral neck FRAX 12%/2.2%  Past medical history, past surgical history, family history and social history were all reviewed and documented in the EPIC chart. Retired. Ankle reconstruction/replacement from fall 2011 one month after retirement. Son has 2 daughters live in [place] active in their lives.  Husband has A Fib doing better.    Exam:  Filed Vitals:   04/13/13 1128  BP: 134/80    General appearance:  Normal Head/Neck:  Normal, without cervical or supraclavicular adenopathy. Thyroid:  Symmetrical, normal in size, without palpable masses or nodularity. Respiratory  Effort:  Normal  Auscultation:  Clear without wheezing or rhonchi Cardiovascular  Auscultation:  Regular rate, without rubs, murmurs or gallops  Edema/varicosities:  Not grossly evident Abdominal  Soft,nontender, without masses, guarding or rebound.  Liver/spleen:  No organomegaly noted  Hernia:  None appreciated  Skin  Inspection:  Grossly normal  Palpation:  Grossly normal Neurologic/psychiatric  Orientation:  Normal with appropriate conversation.  Mood/affect:  Normal  Genitourinary    Breasts: Examined lying and sitting.     Right: Without masses, retractions, discharge or axillary adenopathy.     Left: Without masses, retractions, discharge or axillary adenopathy.   Inguinal/mons:  Normal without inguinal adenopathy  External genitalia:  Normal  BUS/Urethra/Skene's glands:  Normal  Bladder:  Normal  Vagina: Atrophic  Cervix:  Normal  Uterus:  normal in size, shape and contour.  Midline and mobile  Adnexa/parametria:     Rt: Without  masses or tenderness.   Lt: Without masses or tenderness.  Anus and perineum: Normal  Digital rectal exam: Normal sphincter tone without palpated masses or tenderness  Assessment/Plan:  72 y.o.  M. WF G1 P1 for  breast and pelvic exam.  2012 Osteopenia T score -2 left femoral neck Atrophic vaginitis/not sexually active Hypercholesteremia/depression-primary care manages labs and meds  Plan: Repeat dexa this year. Reviewed importance of regular wt bearing exercise, home safety and fall prevention.  SBE's, continue annual screen, Vit D 2000 daily and calcium rich diet.  Pap, pap normal 2011, new screening guidelines reviewed.  Continue annual flu vaccine. Marland Kitchen    [name] [facility], 12:04 PM 04/13/2013

## 2013-04-14 LAB — URINALYSIS W MICROSCOPIC + REFLEX CULTURE

## 2013-04-16 ENCOUNTER — Other Ambulatory Visit: Payer: Self-pay | Admitting: *Deleted

## 2013-04-16 ENCOUNTER — Other Ambulatory Visit: Payer: Medicare Other

## 2013-04-16 DIAGNOSIS — R350 Frequency of micturition: Secondary | ICD-10-CM

## 2013-04-17 LAB — URINALYSIS W MICROSCOPIC + REFLEX CULTURE
Bilirubin Urine: NEGATIVE
Glucose, UA: NEGATIVE mg/dL
Protein, ur: 30 mg/dL — AB
Urobilinogen, UA: 0.2 mg/dL (ref 0.0–1.0)
WBC, UA: >50 WBC/hpf — AB (ref <3)

## 2013-04-18 ENCOUNTER — Other Ambulatory Visit: Payer: Self-pay | Admitting: *Deleted

## 2013-04-18 DIAGNOSIS — N390 Urinary tract infection, site not specified: Secondary | ICD-10-CM

## 2013-04-18 MED ORDER — CIPROFLOXACIN HCL 250 MG PO TABS: 250.0000 mg | ORAL_TABLET | Freq: Two times a day (BID) | ORAL | Status: DC

## 2013-04-19 LAB — URINE CULTURE: Colony Count: >=100000

## 2013-04-23 ENCOUNTER — Ambulatory Visit (INDEPENDENT_AMBULATORY_CARE_PROVIDER_SITE_OTHER): Payer: Medicare Other | Admitting: Neurology

## 2013-04-23 VITALS — BP 150/78 | HR 80 | Temp 98.3°F | Resp 16 | Wt 174.5 lb

## 2013-04-23 DIAGNOSIS — G250 Essential tremor: Secondary | ICD-10-CM

## 2013-04-23 DIAGNOSIS — G251 Drug-induced tremor: Secondary | ICD-10-CM

## 2013-04-23 DIAGNOSIS — R51 Headache: Secondary | ICD-10-CM

## 2013-04-23 NOTE — Progress Notes (Signed)
Subjective:   Courtney Nash was seen in consultation in the movement disorder clinic at the request of Charolett Bumpers, MD.  The evaluation is for tremor.  The patient is a 72 y.o. left handed female with a history of tremor.   She states that she has had tremor for years (maybe 4-5 years ago) and was initially told that it was due to age.  It may have become more noticeable over the last year and she notices it more when she picks up a newspaper.   She does not have it at rest. There is a family hx of tremor in her younger brother (14 years younger).  She is currently on VPA and has been on that for 1 year for the tx of headache.  She does have a hx of migraine but those have been gone with the addition of VPA.  She still has some [facility].  She was previously on topamax but d/c that because of taste aversion.  She has been on multiple antidepressants for headache.  Her daily headaches are holocephalic (her migraines were R sided).  The headaches now are a dull ache.  She sees Dr. Zachery Conch for HA at the [facility].  Affected by caffeine:  Unknown, doesn't drink much Affected by alcohol: doesn't drink EtOH Affected by stress:  no Affected by fatigue:  no Spills soup if on spoon:  no Spills glass of liquid if full:  no Affects ADL's (tying shoes, brushing teeth, etc):  no  Current/Previously tried tremor medications: n/a  Current medications that may exacerbate tremor:  VPA  Outside reports reviewed: historical medical records.  Allergies  Allergen Reactions  . Aspirin   . Motrin [Ibuprofen]     Current Outpatient Prescriptions on File Prior to Visit  Medication Sig Dispense Refill  . APAP-Isometheptene-Caffeine (PRODRIN) 500-130-20 MG TABS Take by mouth.      Marland Kitchen atorvastatin (LIPITOR) 10 MG tablet Take 10 mg by mouth daily.      . baclofen (LIORESAL) 10 MG tablet Take 10 mg by mouth 3 (three) times daily.      Marland Kitchen CALCIUM-VITAMIN D PO Take by mouth. CALCIUM 600  + D BID.       Marland Kitchen Cholecalciferol (VITAMIN D) 1000 UNITS capsule Take 1,000 Units by mouth daily.        . ciprofloxacin (CIPRO) 250 MG tablet Take 1 tablet (250 mg total) by mouth 2 (two) times daily.  6 tablet  0  . divalproex (DEPAKOTE ER) 250 MG 24 hr tablet Take 500 mg by mouth daily.      . Glucosamine-Chondroit-Vit C-Mn (GLUCOSAMINE CHONDR 1500 COMPLX PO) Take by mouth.      . GuaiFENesin (MUCINEX PO) Take by mouth.        . Loratadine (CLARITIN PO) Take by mouth.        . MELATONIN PO Take by mouth.      . Multiple Vitamin (MULTIVITAMIN PO) Take by mouth.        . Omeprazole (PRILOSEC PO) Take by mouth.        . Promethazine HCl (PHENERGAN PO) Take by mouth.        . rizatriptan (MAXALT) 10 MG tablet Take 10 mg by mouth as needed. May repeat in 2 hours if needed       . venlafaxine XR (EFFEXOR XR) 75 MG 24 hr capsule Take 1 capsule (75 mg total) by mouth daily.  30 capsule  12   No current  facility-administered medications on file prior to visit.    Past Medical History  Diagnosis Date  . PMB (postmenopausal bleeding)   . Atrophic vaginitis   . Osteopenia   . Osteoarthritis     Past Surgical History  Procedure Laterality Date  . Total ankle replacement  2011  . Cataract extraction  2009    BILATERAL CATARACT SURGERY W LENS IMPLANTS.  Marland Kitchen Excision of vaginal ulcer    . Hysteroscopy      D&C, Hysteroscopy  . Ankle surgery  2007, 2008  . Hand surgery  2008 AND 2009    THUMB-2008, FINGER-2009  . Eye surgery  2009    RETINAL TEAR TREATED W LASER SURGERY PERFORMED BY DR ,. Luciana Axe    History   Social History  . Marital Status: Married    Spouse Name: N/A    Number of Children: N/A  . Years of Education: N/A   Occupational History  . Not on file.   Social History Main Topics  . Smoking status: Never Smoker   . Smokeless tobacco: Never Used  . Alcohol Use: No  . Drug Use: No  . Sexual Activity: No   Other Topics Concern  . Not on file   Social History  Narrative  . No narrative on file    No family status information on file.    Review of Systems A complete 10 system ROS was obtained and was negative apart from what is mentioned.   Objective:   VITALS:  There were no vitals filed for this visit. Gen:  Appears stated age and in NAD. HEENT:  Normocephalic, atraumatic. The mucous membranes are moist. The superficial temporal arteries are without ropiness or tenderness. Cardiovascular: Regular rate and rhythm. Lungs: Clear to auscultation bilaterally. Neck: There are no carotid bruits noted bilaterally.  NEUROLOGICAL:  Orientation:  The patient is alert and oriented x 3.  Recent and remote memory are intact.  Attention span and concentration are normal.  Able to name objects and repeat without trouble.  Fund of knowledge is appropriate Cranial nerves: There is good facial symmetry. The pupils are equal round and reactive to light bilaterally. Fundoscopic exam reveals clear disc margins bilaterally. Extraocular muscles are intact and visual fields are full to confrontational testing. Speech is fluent and clear. Soft palate rises symmetrically and there is no tongue deviation. Hearing is intact to conversational tone. Tone: Tone is good throughout. Sensation: Sensation is intact to light touch and pinprick throughout (facial, trunk, extremities). Vibration is intact at the bilateral big toe. There is no extinction with double simultaneous stimulation. There is no sensory dermatomal level identified. Coordination:  The patient has no dysdiadichokinesia or dysmetria. Motor: Strength is 5/5 in the bilateral upper and lower extremities.  Shoulder shrug is equal bilaterally.  There is no pronator drift.  There are no fasciculations noted. DTR's: Deep tendon reflexes are 2/4 at the bilateral biceps, triceps, brachioradialis, 2+/4 at the bilateral patella and 2/4 at the bilateral achilles.  Plantar responses are downgoing bilaterally. Gait and  Station: The patient is able to ambulate without difficulty. The patient is able to heel toe walk without any difficulty. The patient is able to ambulate in a tandem fashion. The patient is able to stand in the Romberg position.   MOVEMENT EXAM: Tremor:  There is minimal tremor of the outstretched hands that does not significantly increase with intention.  She has no trouble with Archimedes spirals.  There is no tremor at rest.  The patient  is able to pour water from one glass to another without spilling it.     Assessment/Plan:   1.  Tremor.  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  I see no evidence of any type of neurodegenerative process, such as Parkinson's disease.  -If not already done so, it would be advisable for her to have her TSH, B12, CBC and chem checked  -She and I both agree that we would not add any medications for this.  She will talk about any potential change in her migraine therapy with Dr. Zachery Conch.  I will see her on an as-needed basis.

## 2013-04-23 NOTE — Patient Instructions (Addendum)
1.  You have a tremor that has been exacerbated or caused by depakote.  I don't think that you need any treatment for this. 2. Call me if you need me.

## 2013-04-25 ENCOUNTER — Other Ambulatory Visit: Payer: Self-pay | Admitting: Gynecology

## 2013-04-25 DIAGNOSIS — M8580 Other specified disorders of bone density and structure, unspecified site: Secondary | ICD-10-CM

## 2013-04-27 ENCOUNTER — Other Ambulatory Visit: Payer: Self-pay | Admitting: Obstetrics and Gynecology

## 2013-05-03 ENCOUNTER — Other Ambulatory Visit: Payer: Medicare Other

## 2013-05-03 ENCOUNTER — Ambulatory Visit (INDEPENDENT_AMBULATORY_CARE_PROVIDER_SITE_OTHER): Payer: Medicare Other

## 2013-05-03 DIAGNOSIS — N390 Urinary tract infection, site not specified: Secondary | ICD-10-CM

## 2013-05-03 DIAGNOSIS — M8580 Other specified disorders of bone density and structure, unspecified site: Secondary | ICD-10-CM

## 2013-05-03 DIAGNOSIS — M899 Disorder of bone, unspecified: Secondary | ICD-10-CM

## 2013-05-04 LAB — URINALYSIS W MICROSCOPIC + REFLEX CULTURE
Casts: NONE SEEN
Ketones, ur: NEGATIVE mg/dL
Nitrite: NEGATIVE
Protein, ur: NEGATIVE mg/dL
Urobilinogen, UA: 0.2 mg/dL (ref 0.0–1.0)

## 2013-05-04 LAB — URINE CULTURE
Colony Count: NO GROWTH
Organism ID, Bacteria: NO GROWTH

## 2013-05-10 ENCOUNTER — Other Ambulatory Visit: Payer: Medicare Other

## 2013-05-10 ENCOUNTER — Encounter: Payer: Self-pay | Admitting: Women's Health

## 2013-05-10 ENCOUNTER — Other Ambulatory Visit: Payer: Self-pay | Admitting: *Deleted

## 2013-05-10 DIAGNOSIS — M810 Age-related osteoporosis without current pathological fracture: Secondary | ICD-10-CM

## 2013-05-11 LAB — VITAMIN D 25 HYDROXY (VIT D DEFICIENCY, FRACTURES): Vit D, 25-Hydroxy: 38 ng/mL (ref 30–89)

## 2013-05-11 LAB — PTH, INTACT AND CALCIUM
Calcium: 9.8 mg/dL (ref 8.4–10.5)
PTH: 21.1 pg/mL (ref 14.0–72.0)

## 2013-05-23 ENCOUNTER — Encounter: Payer: Self-pay | Admitting: Gynecology

## 2013-05-23 ENCOUNTER — Ambulatory Visit (INDEPENDENT_AMBULATORY_CARE_PROVIDER_SITE_OTHER): Payer: Medicare Other | Admitting: Gynecology

## 2013-05-23 VITALS — BP 120/72

## 2013-05-23 DIAGNOSIS — M899 Disorder of bone, unspecified: Secondary | ICD-10-CM

## 2013-05-23 DIAGNOSIS — M8580 Other specified disorders of bone density and structure, unspecified site: Secondary | ICD-10-CM

## 2013-05-23 MED ORDER — RISEDRONATE SODIUM 150 MG PO TABS: 150.0000 mg | ORAL_TABLET | ORAL | Status: DC

## 2013-05-23 NOTE — Patient Instructions (Signed)
Alendronate tablets What is this medicine? ALENDRONATE (a LEN droe nate) slows calcium loss from bones. It helps to make normal healthy bone and to slow bone loss in people with Paget's disease and osteoporosis. It may be used in others at risk for bone loss. This medicine may be used for other purposes; ask your health care provider or pharmacist if you have questions. What should I tell my health care provider before I take this medicine? They need to know if you have any of these conditions: -dental disease -esophagus, stomach, or intestine problems, like acid reflux or GERD -kidney disease -low blood calcium -low vitamin D -problems sitting or standing 30 minutes -trouble swallowing -an unusual or allergic reaction to alendronate, other medicines, foods, dyes, or preservatives -pregnant or trying to get pregnant -breast-feeding How should I use this medicine? You must take this medicine exactly as directed or you will lower the amount of the medicine you absorb into your body or you may cause yourself harm. Take this medicine by mouth first thing in the morning, after you are up for the day. Do not eat or drink anything before you take your medicine. Swallow the tablet with a full glass (6 to 8 fluid ounces) of plain water. Do not take this medicine with any other drink. Do not chew or crush the tablet. After taking this medicine, do not eat breakfast, drink, or take any medicines or vitamins for at least 30 minutes. Sit or stand up for at least 30 minutes after you take this medicine; do not lie down. Do not take your medicine more often than directed. Talk to your pediatrician regarding the use of this medicine in children. Special care may be needed. Overdosage: If you think you have taken too much of this medicine contact a poison control center or emergency room at once. NOTE: This medicine is only for you. Do not share this medicine with others. What if I miss a dose? If you miss a  dose, do not take it later in the day. Continue your normal schedule starting the next morning. Do not take double or extra doses. What may interact with this medicine? -aluminum hydroxide -antacids -aspirin -calcium supplements -drugs for inflammation like ibuprofen, naproxen, and others -iron supplements -magnesium supplements -vitamins with minerals This list may not describe all possible interactions. Give your health care provider a list of all the medicines, herbs, non-prescription drugs, or dietary supplements you use. Also tell them if you smoke, drink alcohol, or use illegal drugs. Some items may interact with your medicine. What should I watch for while using this medicine? Visit your doctor or health care professional for regular checks ups. It may be some time before you see benefit from this medicine. Do not stop taking your medicine except on your doctor's advice. Your doctor or health care professional may order blood tests and other tests to see how you are doing. You should make sure you get enough calcium and vitamin D while you are taking this medicine, unless your doctor tells you not to. Discuss the foods you eat and the vitamins you take with your health care professional. Some people who take this medicine have severe bone, joint, and/or muscle pain. This medicine may also increase your risk for a broken thigh bone. Tell your doctor right away if you have pain in your upper leg or groin. Tell your doctor if you have any pain that does not go away or that gets worse. This medicine can make  you more sensitive to the sun. If you get a rash while taking this medicine, sunlight may cause the rash to get worse. Keep out of the sun. If you cannot avoid being in the sun, wear protective clothing and use sunscreen. Do not use sun lamps or tanning beds/booths. What side effects may I notice from receiving this medicine? Side effects that you should report to your doctor or health care  professional as soon as possible: -allergic reactions like skin rash, itching or hives, swelling of the face, lips, or tongue -black or tarry stools -bone, muscle or joint pain -changes in vision -chest pain -heartburn or stomach pain -jaw pain, especially after dental work -pain or trouble when swallowing -redness, blistering, peeling or loosening of the skin, including inside the mouth Side effects that usually do not require medical attention (report to your doctor or health care professional if they continue or are bothersome): -changes in taste -diarrhea or constipation -eye pain or itching -headache -nausea or vomiting -stomach gas or fullness This list may not describe all possible side effects. Call your doctor for medical advice about side effects. You may report side effects to FDA at 1-800-FDA-1088. Where should I keep my medicine? Keep out of the reach of children. Store at room temperature of 15 and 30 degrees C (59 and 86 degrees F). Throw away any unused medicine after the expiration date. NOTE: This sheet is a summary. It may not cover all possible information. If you have questions about this medicine, talk to your doctor, pharmacist, or health care provider.  2012, [employer]. (01/08/2011 8:56:09 AM)

## 2013-05-23 NOTE — Progress Notes (Signed)
Patient is a 72 year old who presented to the office today to discuss the results of her recent bone density study and to compare with previous study as well. Patient with known history of osteopenia (decreased bone mineralization). Patient currently taking calcium and vitamin D and maintains an active lifestyle. Recent calcium, PTH and vitamin D levels were normal. Her bone density study was compare with 2 years ago results as follows:  Patient's lowest T score was at the left femoral neck with a value of -2.0. Her FRAX Analysis demonstrated that her attending ear fracture risk of her left hip was 3.7% exceeding threshold of 3.0. Her overall osteoporotic risk was 19% over the next 10 years whereby the threshold cut off with 20%.  I have explained to her that based on these scores her bone mass is 25% below normal.  Based on patient's age and prior history of ankle fracture although there's questionable of it being truly traumatic after the age of 39 and currently on no hormone replacement therapy and her bone density analysis described above she will be a candidate for antiresorptive agent. We went through a detailed discussion of the different types of medications. To prevent her bones from deteriorating further.  She is going to be prescribed Actonel 150 mg one by mouth q. Monthly. The risks benefits and pros and cons of the medication were discussed include osteonecrosis of the jaw and spontaneous subtrochanteric fractures. We'll repeat her bone density study in one year to monitor response to treatment. Literature and information was provided and all questions are answered.

## 2013-05-31 ENCOUNTER — Other Ambulatory Visit: Payer: Self-pay

## 2013-06-13 ENCOUNTER — Other Ambulatory Visit: Payer: Self-pay | Admitting: Gynecology

## 2013-06-13 ENCOUNTER — Other Ambulatory Visit: Payer: Self-pay | Admitting: Women's Health

## 2013-06-13 NOTE — Telephone Encounter (Signed)
Telephone Call, states take 75 mg at bedtime which helps with hot flushes and would like to continue. Has been on for years.

## 2014-03-07 ENCOUNTER — Other Ambulatory Visit: Payer: Self-pay

## 2014-03-07 DIAGNOSIS — Z1231 Encounter for screening mammogram for malignant neoplasm of breast: Secondary | ICD-10-CM

## 2014-03-19 ENCOUNTER — Ambulatory Visit: Payer: Medicare Other

## 2014-03-26 ENCOUNTER — Ambulatory Visit
Admission: RE | Admit: 2014-03-26 | Discharge: 2014-03-26 | Disposition: A | Discharge status: Home / Self Care | Payer: 59 | Source: Ambulatory Visit

## 2014-03-26 DIAGNOSIS — Z1231 Encounter for screening mammogram for malignant neoplasm of breast: Secondary | ICD-10-CM

## 2014-04-04 ENCOUNTER — Ambulatory Visit (HOSPITAL_COMMUNITY)
Admission: RE | Admit: 2014-04-04 | Discharge: 2014-04-04 | Disposition: A | Discharge status: Home / Self Care | Payer: Medicare Other | Source: Ambulatory Visit | Attending: Internal Medicine | Admitting: Internal Medicine

## 2014-04-04 ENCOUNTER — Other Ambulatory Visit (HOSPITAL_COMMUNITY): Payer: Self-pay | Admitting: Internal Medicine

## 2014-04-04 DIAGNOSIS — M79609 Pain in unspecified limb: Secondary | ICD-10-CM

## 2014-04-04 DIAGNOSIS — M7989 Other specified soft tissue disorders: Secondary | ICD-10-CM

## 2014-04-16 ENCOUNTER — Ambulatory Visit (INDEPENDENT_AMBULATORY_CARE_PROVIDER_SITE_OTHER): Payer: Medicare Other | Admitting: Women's Health

## 2014-04-16 ENCOUNTER — Encounter: Payer: Self-pay | Admitting: Women's Health

## 2014-04-16 VITALS — BP 140/80 | Ht 66.0 in | Wt 172.0 lb

## 2014-04-16 DIAGNOSIS — M899 Disorder of bone, unspecified: Secondary | ICD-10-CM

## 2014-04-16 DIAGNOSIS — M8580 Other specified disorders of bone density and structure, unspecified site: Secondary | ICD-10-CM

## 2014-04-16 DIAGNOSIS — M949 Disorder of cartilage, unspecified: Secondary | ICD-10-CM

## 2014-04-16 MED ORDER — RISEDRONATE SODIUM 150 MG PO TABS: 150.0000 mg | ORAL_TABLET | ORAL | Status: DC

## 2014-04-16 NOTE — Progress Notes (Signed)
HYDIE LANGAN 1941-03-30 [id number]    History:    Presents for breast and pelvic exam.  Still experiencing problem starting urine stream, able to empty after starting- has seen urology.  Bakers cyst/ruptured in right leg early  September 2015.  History of normal paps and mammograms.   Not sexually active- dryness and husband's health.  Benign breast biopsy right breast in the 80s. Negative colonoscopy 2013. DEXA 04/2013 T score -2 left femoral neck FRAX 19%/3.7%.  Doing well on Actonel.  PCP manages routine labs and medications.    Past medical history, past surgical history, family history and social history were all reviewed and documented in the EPIC chart.  Retired. Ankle reconstruction/replacement from fall 2011 one month after retirement. Son has 2 daughters live in [place] active in their lives. Expecting grandson- due this fall.  Husband has A Fib doing better.    ROS:  A  12 point ROS was performed and pertinent positives and negatives are included.  Exam:  Filed Vitals:   04/16/14 1144  BP: 140/80    General appearance:  Normal Thyroid:  Symmetrical, normal in size, without palpable masses or nodularity. Respiratory  Auscultation:  Clear without wheezing or rhonchi Cardiovascular  Auscultation:  Regular rate, without rubs, murmurs or gallops  Edema/varicosities:  Not grossly evident Abdominal  Soft,nontender, without masses, guarding or rebound.  Liver/spleen:  No organomegaly noted  Hernia:  None appreciated  Skin  Inspection:  Grossly normal   Breasts: Examined lying and sitting.     Right: Without masses, retractions, discharge or axillary adenopathy.     Left: Without masses, retractions, discharge or axillary adenopathy. Gentitourinary   Inguinal/mons:  Normal without inguinal adenopathy  External genitalia:  Normal  BUS/Urethra/Skene's glands:  Normal  Vagina:  Atrophic  Cervix:  Normal  Uterus:  Normal in size, shape and contour.  Midline and  mobile  Adnexa/parametria:     Rt: Without masses or tenderness.   Lt: Without masses or tenderness.  Anus and perineum: Normal  Digital rectal exam: Normal sphincter tone without palpated masses or tenderness  Assessment/Plan:  73 y.o. MWF G1P1 for breast and pelvic exam.     Osteopenia with elevated FRAX on actonel Atrophic vaginitis- not sexually active  Hypercholesteremia/depression-primary care manages labs and meds   Plan: Actonel 150 mg monthly, proper administration reviewed. Repeat dexa after one year on actonel, will schedule. Reviewed importance of regular wt bearing exercise, home safety and fall prevention. SBE's, continue annual screen, Vit D 2000 daily and calcium rich diet. Normal pap 2011, new screening guidelines reviewed. Normal mammogram (3D) 2014- continue 3D tomography.  Continue annual flu vaccine.  PCP to check vit D level at visit next month.     [name] [facility], 12:27 PM 04/16/2014

## 2014-04-16 NOTE — Patient Instructions (Signed)
Health Recommendations for Postmenopausal Women Respected and ongoing research has looked at the most common causes of death, disability, and poor quality of life in postmenopausal women. The causes include heart disease, diseases of blood vessels, diabetes, depression, cancer, and bone loss (osteoporosis). Many things can be done to help lower the chances of developing these and other common problems. CARDIOVASCULAR DISEASE Heart Disease: A heart attack is a medical emergency. Know the signs and symptoms of a heart attack. Below are things women can do to reduce their risk for heart disease.   Do not smoke. If you smoke, quit.  Aim for a healthy weight. Being overweight causes many preventable deaths. Eat a healthy and balanced diet and drink an adequate amount of liquids.  Get moving. Make a commitment to be more physically active. Aim for 30 minutes of activity on most, if not all days of the week.  Eat for heart health. Choose a diet that is low in saturated fat and cholesterol and eliminate trans fat. Include whole grains, vegetables, and fruits. Read and understand the labels on food containers before buying.  Know your numbers. Ask your caregiver to check your blood pressure, cholesterol (total, HDL, LDL, triglycerides) and blood glucose. Work with your caregiver on improving your entire clinical picture.  High blood pressure. Limit or stop your table salt intake (try salt substitute and food seasonings). Avoid salty foods and drinks. Read labels on food containers before buying. Eating well and exercising can help control high blood pressure. STROKE  Stroke is a medical emergency. Stroke may be the result of a blood clot in a blood vessel in the brain or by a brain hemorrhage (bleeding). Know the signs and symptoms of a stroke. To lower the risk of developing a stroke:  Avoid fatty foods.  Quit smoking.  Control your diabetes, blood pressure, and irregular heart rate. THROMBOPHLEBITIS  (BLOOD CLOT) OF THE LEG  Becoming overweight and leading a stationary lifestyle may also contribute to developing blood clots. Controlling your diet and exercising will help lower the risk of developing blood clots. CANCER SCREENING  Breast Cancer: Take steps to reduce your risk of breast cancer.  You should practice "breast self-awareness." This means understanding the normal appearance and feel of your breasts and should include breast self-examination. Any changes detected, no matter how small, should be reported to your caregiver.  After age 40, you should have a clinical breast exam (CBE) every year.  Starting at age 40, you should consider having a mammogram (breast X-ray) every year.  If you have a family history of breast cancer, talk to your caregiver about genetic screening.  If you are at high risk for breast cancer, talk to your caregiver about having an MRI and a mammogram every year.  Intestinal or Stomach Cancer: Tests to consider are a rectal exam, fecal occult blood, sigmoidoscopy, and colonoscopy. Women who are high risk may need to be screened at an earlier age and more often.  Cervical Cancer:  Beginning at age 30, you should have a Pap test every 3 years as long as the past 3 Pap tests have been normal.  If you have had past treatment for cervical cancer or a condition that could lead to cancer, you need Pap tests and screening for cancer for at least 20 years after your treatment.  If you had a hysterectomy for a problem that was not cancer or a condition that could lead to cancer, then you no longer need Pap tests.    If you are between ages 65 and 70, and you have had normal Pap tests going back 10 years, you no longer need Pap tests.  If Pap tests have been discontinued, risk factors (such as a new sexual partner) need to be reassessed to determine if screening should be resumed.  Some medical problems can increase the chance of getting cervical cancer. In these  cases, your caregiver may recommend more frequent screening and Pap tests.  Uterine Cancer: If you have vaginal bleeding after reaching menopause, you should notify your caregiver.  Ovarian Cancer: Other than yearly pelvic exams, there are no reliable tests available to screen for ovarian cancer at this time except for yearly pelvic exams.  Lung Cancer: Yearly chest X-rays can detect lung cancer and should be done on high risk women, such as cigarette smokers and women with chronic lung disease (emphysema).  Skin Cancer: A complete body skin exam should be done at your yearly examination. Avoid overexposure to the sun and ultraviolet light lamps. Use a strong sun block cream when in the sun. All of these things are important for lowering the risk of skin cancer. MENOPAUSE Menopause Symptoms: Hormone therapy products are effective for treating symptoms associated with menopause:  Moderate to severe hot flashes.  Night sweats.  Mood swings.  Headaches.  Tiredness.  Loss of sex drive.  Insomnia.  Other symptoms. Hormone replacement carries certain risks, especially in older women. Women who use or are thinking about using estrogen or estrogen with progestin treatments should discuss that with their caregiver. Your caregiver will help you understand the benefits and risks. The ideal dose of hormone replacement therapy is not known. The Food and Drug Administration (FDA) has concluded that hormone therapy should be used only at the lowest doses and for the shortest amount of time to reach treatment goals.  OSTEOPOROSIS Protecting Against Bone Loss and Preventing Fracture If you use hormone therapy for prevention of bone loss (osteoporosis), the risks for bone loss must outweigh the risk of the therapy. Ask your caregiver about other medications known to be safe and effective for preventing bone loss and fractures. To guard against bone loss or fractures, the following is recommended:  If  you are younger than age 50, take 1000 mg of calcium and at least 600 mg of Vitamin D per day.  If you are older than age 50 but younger than age 70, take 1200 mg of calcium and at least 600 mg of Vitamin D per day.  If you are older than age 70, take 1200 mg of calcium and at least 800 mg of Vitamin D per day. Smoking and excessive alcohol intake increases the risk of osteoporosis. Eat foods rich in calcium and vitamin D and do weight bearing exercises several times a week as your caregiver suggests. DIABETES Diabetes Mellitus: If you have type I or type 2 diabetes, you should keep your blood sugar under control with diet, exercise, and recommended medication. Avoid starchy and fatty foods, and too many sweets. Being overweight can make diabetes control more difficult. COGNITION AND MEMORY Cognition and Memory: Menopausal hormone therapy is not recommended for the prevention of cognitive disorders such as Alzheimer's disease or memory loss.  DEPRESSION  Depression may occur at any age, but it is common in elderly women. This may be because of physical, medical, social (loneliness), or financial problems and needs. If you are experiencing depression because of medical problems and control of symptoms, talk to your caregiver about this. Physical   activity and exercise may help with mood and sleep. Community and volunteer involvement may improve your sense of value and worth. If you have depression and you feel that the problem is getting worse or becoming severe, talk to your caregiver about which treatment options are best for you. ACCIDENTS  Accidents are common and can be serious in elderly woman. Prepare your house to prevent accidents. Eliminate throw rugs, place hand bars in bath, shower, and toilet areas. Avoid wearing high heeled shoes or walking on wet, snowy, and icy areas. Limit or stop driving if you have vision or hearing problems, or if you feel you are unsteady with your movements and  reflexes. HEPATITIS C Hepatitis C is a type of viral infection affecting the liver. It is spread mainly through contact with blood from an infected person. It can be treated, but if left untreated, it can lead to severe liver damage over the years. Many people who are infected do not know that the virus is in their blood. If you are a "baby-boomer", it is recommended that you have one screening test for Hepatitis C. IMMUNIZATIONS  Several immunizations are important to consider having during your senior years, including:   Tetanus, diphtheria, and pertussis booster shot.  Influenza every year before the flu season begins.  Pneumonia vaccine.  Shingles vaccine.  Others, as indicated based on your specific needs. Talk to your caregiver about these. Document Released: 09/03/2005 Document Revised: 11/26/2013 Document Reviewed: 04/29/2008 Exit[employer] Patient Information 2015 [employer]. This information is not intended to replace advice given to you by your health care provider. Make sure you discuss any questions you have with your health care provider.  

## 2014-05-27 ENCOUNTER — Encounter: Payer: Self-pay | Admitting: Women's Health

## 2014-06-27 ENCOUNTER — Other Ambulatory Visit: Payer: Self-pay

## 2014-06-27 DIAGNOSIS — N951 Menopausal and female climacteric states: Secondary | ICD-10-CM

## 2014-06-27 MED ORDER — VENLAFAXINE HCL ER 75 MG PO CP24: 75.0000 mg | ORAL_CAPSULE | Freq: Every day | ORAL | Status: DC

## 2014-06-27 NOTE — Telephone Encounter (Signed)
Telephone call, states takes Effexor 75 for hot flushes, has been off now several weeks and hot flushes had increased would like to continue, states has been on for several years.  Please E scribe Effexor 75 1 daily #30 with 12 refills.

## 2014-07-09 ENCOUNTER — Other Ambulatory Visit: Payer: Self-pay | Admitting: Gynecology

## 2014-07-09 DIAGNOSIS — M8580 Other specified disorders of bone density and structure, unspecified site: Secondary | ICD-10-CM

## 2014-07-11 ENCOUNTER — Ambulatory Visit (INDEPENDENT_AMBULATORY_CARE_PROVIDER_SITE_OTHER): Payer: Medicare Other

## 2014-07-11 DIAGNOSIS — M8588 Other specified disorders of bone density and structure, other site: Secondary | ICD-10-CM

## 2014-07-11 DIAGNOSIS — M8580 Other specified disorders of bone density and structure, unspecified site: Secondary | ICD-10-CM

## 2014-07-22 ENCOUNTER — Other Ambulatory Visit: Payer: Self-pay | Admitting: Gynecology

## 2015-01-20 ENCOUNTER — Other Ambulatory Visit: Payer: Self-pay

## 2015-02-25 ENCOUNTER — Other Ambulatory Visit: Payer: Self-pay

## 2015-02-25 DIAGNOSIS — Z1231 Encounter for screening mammogram for malignant neoplasm of breast: Secondary | ICD-10-CM

## 2015-04-04 ENCOUNTER — Ambulatory Visit
Admission: RE | Admit: 2015-04-04 | Discharge: 2015-04-04 | Disposition: A | Discharge status: Home / Self Care | Payer: Medicare Other | Source: Ambulatory Visit

## 2015-04-04 DIAGNOSIS — Z1231 Encounter for screening mammogram for malignant neoplasm of breast: Secondary | ICD-10-CM

## 2015-04-07 ENCOUNTER — Encounter: Payer: Self-pay | Admitting: Women's Health

## 2015-05-08 ENCOUNTER — Encounter: Payer: Self-pay | Admitting: Women's Health

## 2015-05-08 ENCOUNTER — Ambulatory Visit (INDEPENDENT_AMBULATORY_CARE_PROVIDER_SITE_OTHER): Payer: Medicare Other | Admitting: Women's Health

## 2015-05-08 VITALS — BP 128/74 | Ht 66.0 in | Wt 167.0 lb

## 2015-05-08 DIAGNOSIS — M8580 Other specified disorders of bone density and structure, unspecified site: Secondary | ICD-10-CM | POA: Diagnosis not present

## 2015-05-08 DIAGNOSIS — Z23 Encounter for immunization: Secondary | ICD-10-CM

## 2015-05-08 DIAGNOSIS — Z01419 Encounter for gynecological examination (general) (routine) without abnormal findings: Secondary | ICD-10-CM

## 2015-05-08 MED ORDER — ALENDRONATE SODIUM 70 MG PO TABS: 70.0000 mg | ORAL_TABLET | ORAL | Status: DC

## 2015-05-08 NOTE — Patient Instructions (Signed)

## 2015-05-08 NOTE — Progress Notes (Signed)
Patient ID: Courtney Nash Denault, female   DOB: 01/25/1941, [age] y.o.   MRN: [id number] Courtney Nash Mcclafferty 07/09/1941 [id number]    History:    Presents for annual exam with no concerns.  Postmenopausal, amenorrhea, not sexually active. Normal pap history, benign mammogram history 03/2015, Benign breast biopsy right breast in the 80s. Negative colonoscopy 2013.  Osteopenia on Actonel with no side effects since 2014, DEXA 2015 showed increased bone density.  Has had 2 falls in the past year with no fractures.  Difficulty starting urine stream still and urge incontinence, not wearing a pad.  Up to date on flu, pneumonia, (had bad reaction to Prevnar 13 vaccine) and shingles vaccines.   Past medical history, past surgical history, family history and social history were all reviewed and documented in the EPIC chart.  Retired.  Ankle reconstruction/replacement from fall 2011, ruptured bakers cyst 03/2014.  One son with three  children, girls 6, 869, boy 1 .  Husband has A. Fib, and is recovering from a fall from his horse.    ROS:  A ROS was performed and pertinent positives and negatives are included.  Exam:  Filed Vitals:   05/08/15 1402  BP: 128/74    General appearance:  Normal Thyroid:  Symmetrical, normal in size, without palpable masses or nodularity. Respiratory  Auscultation:  Clear without wheezing or rhonchi Cardiovascular  Auscultation:  Regular rate, without rubs, murmurs or gallops  Edema/varicosities:  Not grossly evident Abdominal  Soft,nontender, without masses, guarding or rebound.  Liver/spleen:  No organomegaly noted  Hernia:  None appreciated  Skin  Inspection:  Grossly normal   Breasts: Examined lying and sitting.     Right: Without masses, retractions, discharge or axillary adenopathy.     Left: Without masses, retractions, discharge or axillary adenopathy. Gentitourinary   Inguinal/mons:  Normal without inguinal adenopathy  External genitalia:  Fused labia  BUS/Urethra/Skene's  glands:  Normal  Vagina:  Normal  Cervix:  Normal  Uterus:  Normal in size, shape and contour.  Midline and mobile  Adnexa/parametria:     Rt: Without masses or tenderness.   Lt: Without masses or tenderness.  Anus and perineum: Normal  Digital rectal exam: Normal sphincter tone without palpated masses or tenderness  Assessment/Plan:  74 y.o.  G1P1 MWF for annual exam.     Postmenopausal with atrophic changes no HRT with no bleeding Osteopenia- improving on Actonel Hypercholesteremia/depression-primary care manages labs and meds    Plan:  Change to more affordable Fosamax 70 mg weekly, proper use and potential side effect of GERD discussed. Instructed to call if difficulty tolerating. Reviewed importance of regular wt bearing exercise, home safety and fall prevention. SBE's, continue annual 3-D mammogram screen, Vit D 2000 daily and calcium rich diet. Normal pap 2011, new screening guidelines reviewed.      Harrington ChallengerYOUNG,Shermar Friedland J WHNP, 3:30 PM 05/08/2015

## 2015-07-25 ENCOUNTER — Other Ambulatory Visit: Payer: Self-pay | Admitting: Women's Health

## 2015-07-25 NOTE — Telephone Encounter (Signed)
Okay for refill, I thought she was getting from her primary care.

## 2015-07-25 NOTE — Telephone Encounter (Signed)
Was seen in annual Oct. For annual

## 2015-09-17 ENCOUNTER — Telehealth: Payer: Self-pay | Admitting: *Deleted

## 2015-09-17 NOTE — Telephone Encounter (Signed)
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 Pt asked if yeast infection should occur if nancy would send a diflucan tablet in for her, I told her to call if this should occur, this was left on her voicemail

## 2015-10-10 ENCOUNTER — Other Ambulatory Visit: Payer: Self-pay | Admitting: Women's Health

## 2016-03-06 ENCOUNTER — Other Ambulatory Visit: Payer: Self-pay | Admitting: Women's Health

## 2016-03-12 ENCOUNTER — Other Ambulatory Visit: Payer: Self-pay | Admitting: Women's Health

## 2016-03-12 ENCOUNTER — Other Ambulatory Visit: Payer: Self-pay | Admitting: Gynecology

## 2016-03-12 DIAGNOSIS — Z1231 Encounter for screening mammogram for malignant neoplasm of breast: Secondary | ICD-10-CM

## 2016-04-08 ENCOUNTER — Ambulatory Visit
Admission: RE | Admit: 2016-04-08 | Discharge: 2016-04-08 | Disposition: A | Discharge status: Home / Self Care | Payer: Medicare Other | Source: Ambulatory Visit | Attending: Women's Health | Admitting: Women's Health

## 2016-04-08 DIAGNOSIS — Z1231 Encounter for screening mammogram for malignant neoplasm of breast: Secondary | ICD-10-CM

## 2016-04-14 ENCOUNTER — Encounter: Payer: Self-pay | Admitting: Women's Health

## 2016-04-19 ENCOUNTER — Other Ambulatory Visit: Payer: Medicare Other

## 2016-04-19 ENCOUNTER — Other Ambulatory Visit: Payer: Self-pay | Admitting: Nurse Practitioner

## 2016-04-19 ENCOUNTER — Ambulatory Visit
Admission: RE | Admit: 2016-04-19 | Discharge: 2016-04-19 | Disposition: A | Discharge status: Home / Self Care | Payer: Medicare Other | Source: Ambulatory Visit | Attending: Nurse Practitioner | Admitting: Nurse Practitioner

## 2016-04-19 DIAGNOSIS — R0781 Pleurodynia: Secondary | ICD-10-CM

## 2016-05-12 ENCOUNTER — Ambulatory Visit (INDEPENDENT_AMBULATORY_CARE_PROVIDER_SITE_OTHER): Payer: Medicare Other | Admitting: Women's Health

## 2016-05-12 ENCOUNTER — Encounter: Payer: Self-pay | Admitting: Women's Health

## 2016-05-12 VITALS — BP 124/80 | Ht 66.0 in | Wt 171.0 lb

## 2016-05-12 DIAGNOSIS — Z01419 Encounter for gynecological examination (general) (routine) without abnormal findings: Secondary | ICD-10-CM | POA: Diagnosis not present

## 2016-05-12 DIAGNOSIS — M8580 Other specified disorders of bone density and structure, unspecified site: Secondary | ICD-10-CM

## 2016-05-12 DIAGNOSIS — M8589 Other specified disorders of bone density and structure, multiple sites: Secondary | ICD-10-CM | POA: Diagnosis not present

## 2016-05-12 MED ORDER — ALENDRONATE SODIUM 70 MG PO TABS: 70.0000 mg | ORAL_TABLET | ORAL | 4 refills | Status: DC

## 2016-05-12 NOTE — Patient Instructions (Signed)
Menopause is a normal process in which your reproductive ability comes to an end. This process happens gradually over a span of months to years, usually between the ages of 48 and 55. Menopause is complete when you have missed 12 consecutive menstrual periods. It is important to talk with your health care provider about some of the most common conditions that affect postmenopausal women, such as heart disease, cancer, and bone loss (osteoporosis). Adopting a healthy lifestyle and getting preventive care can help to promote your health and wellness. Those actions can also lower your chances of developing some of these common conditions. WHAT SHOULD I KNOW ABOUT MENOPAUSE? During menopause, you may experience a number of symptoms, such as:  Moderate-to-severe hot flashes.  Night sweats.  Decrease in sex drive.  Mood swings.  Headaches.  Tiredness.  Irritability.  Memory problems.  Insomnia. Choosing to treat or not to treat menopausal changes is an individual decision that you make with your health care provider. WHAT SHOULD I KNOW ABOUT HORMONE REPLACEMENT THERAPY AND SUPPLEMENTS? Hormone therapy products are effective for treating symptoms that are associated with menopause, such as hot flashes and night sweats. Hormone replacement carries certain risks, especially as you become older. If you are thinking about using estrogen or estrogen with progestin treatments, discuss the benefits and risks with your health care provider. WHAT SHOULD I KNOW ABOUT HEART DISEASE AND STROKE? Heart disease, heart attack, and stroke become more likely as you age. This may be due, in part, to the hormonal changes that your body experiences during menopause. These can affect how your body processes dietary fats, triglycerides, and cholesterol. Heart attack and stroke are both medical emergencies. There are many things that you can do to help prevent heart disease and stroke:  Have your blood pressure  checked at least every 1-2 years. High blood pressure causes heart disease and increases the risk of stroke.  If you are 55-79 years old, ask your health care provider if you should take aspirin to prevent a heart attack or a stroke.  Do not use any tobacco products, including cigarettes, chewing tobacco, or electronic cigarettes. If you need help quitting, ask your health care provider.  It is important to eat a healthy diet and maintain a healthy weight.  Be sure to include plenty of vegetables, fruits, low-fat dairy products, and lean protein.  Avoid eating foods that are high in solid fats, added sugars, or salt (sodium).  Get regular exercise. This is one of the most important things that you can do for your health.  Try to exercise for at least 150 minutes each week. The type of exercise that you do should increase your heart rate and make you sweat. This is known as moderate-intensity exercise.  Try to do strengthening exercises at least twice each week. Do these in addition to the moderate-intensity exercise.  Know your numbers.Ask your health care provider to check your cholesterol and your blood glucose. Continue to have your blood tested as directed by your health care provider. WHAT SHOULD I KNOW ABOUT CANCER SCREENING? There are several types of cancer. Take the following steps to reduce your risk and to catch any cancer development as early as possible. Breast Cancer  Practice breast self-awareness.  This means understanding how your breasts normally appear and feel.  It also means doing regular breast self-exams. Let your health care provider know about any changes, no matter how small.  If you are 40 or older, have a clinician do a   breast exam (clinical breast exam or CBE) every year. Depending on your age, family history, and medical history, it may be recommended that you also have a yearly breast X-ray (mammogram).  If you have a family history of breast cancer,  talk with your health care provider about genetic screening.  If you are at high risk for breast cancer, talk with your health care provider about having an MRI and a mammogram every year.  Breast cancer (BRCA) gene test is recommended for women who have family members with BRCA-related cancers. Results of the assessment will determine the need for genetic counseling and BRCA1 and for BRCA2 testing. BRCA-related cancers include these types:  Breast. This occurs in males or females.  Ovarian.  Tubal. This may also be called fallopian tube cancer.  Cancer of the abdominal or pelvic lining (peritoneal cancer).  Prostate.  Pancreatic. Cervical, Uterine, and Ovarian Cancer Your health care provider may recommend that you be screened regularly for cancer of the pelvic organs. These include your ovaries, uterus, and vagina. This screening involves a pelvic exam, which includes checking for microscopic changes to the surface of your cervix (Pap test).  For women ages 21-65, health care providers may recommend a pelvic exam and a Pap test every three years. For women ages 77-65, they may recommend the Pap test and pelvic exam, combined with testing for human papilloma virus (HPV), every five years. Some types of HPV increase your risk of cervical cancer. Testing for HPV may also be done on women of any age who have unclear Pap test results.  Other health care providers may not recommend any screening for nonpregnant women who are considered low risk for pelvic cancer and have no symptoms. Ask your health care provider if a screening pelvic exam is right for you.  If you have had past treatment for cervical cancer or a condition that could lead to cancer, you need Pap tests and screening for cancer for at least 20 years after your treatment. If Pap tests have been discontinued for you, your risk factors (such as having a new sexual partner) need to be reassessed to determine if you should start having  screenings again. Some women have medical problems that increase the chance of getting cervical cancer. In these cases, your health care provider may recommend that you have screening and Pap tests more often.  If you have a family history of uterine cancer or ovarian cancer, talk with your health care provider about genetic screening.  If you have vaginal bleeding after reaching menopause, tell your health care provider.  There are currently no reliable tests available to screen for ovarian cancer. Lung Cancer Lung cancer screening is recommended for adults 3-70 years old who are at high risk for lung cancer because of a history of smoking. A yearly low-dose CT scan of the lungs is recommended if you:  Currently smoke.  Have a history of at least 30 pack-years of smoking and you currently smoke or have quit within the past 15 years. A pack-year is smoking an average of one pack of cigarettes per day for one year. Yearly screening should:  Continue until it has been 15 years since you quit.  Stop if you develop a health problem that would prevent you from having lung cancer treatment. Colorectal Cancer  This type of cancer can be detected and can often be prevented.  Routine colorectal cancer screening usually begins at age 38 and continues through age 12.  If you have  risk factors for colon cancer, your health care provider may recommend that you be screened at an earlier age.  If you have a family history of colorectal cancer, talk with your health care provider about genetic screening.  Your health care provider may also recommend using home test kits to check for hidden blood in your stool.  A small camera at the end of a tube can be used to examine your colon directly (sigmoidoscopy or colonoscopy). This is done to check for the earliest forms of colorectal cancer.  Direct examination of the colon should be repeated every 5-10 years until age 67. However, if early forms of  precancerous polyps or small growths are found or if you have a family history or genetic risk for colorectal cancer, you may need to be screened more often. Skin Cancer  Check your skin from head to toe regularly.  Monitor any moles. Be sure to tell your health care provider:  About any new moles or changes in moles, especially if there is a change in a mole's shape or color.  If you have a mole that is larger than the size of a pencil eraser.  If any of your family members has a history of skin cancer, especially at a young age, talk with your health care provider about genetic screening.  Always use sunscreen. Apply sunscreen liberally and repeatedly throughout the day.  Whenever you are outside, protect yourself by wearing long sleeves, pants, a wide-brimmed hat, and sunglasses. WHAT SHOULD I KNOW ABOUT OSTEOPOROSIS? Osteoporosis is a condition in which bone destruction happens more quickly than new bone creation. After menopause, you may be at an increased risk for osteoporosis. To help prevent osteoporosis or the bone fractures that can happen because of osteoporosis, the following is recommended:  If you are 39-61 years old, get at least 1,000 mg of calcium and at least 600 mg of vitamin D per day.  If you are older than age 16 but younger than age 7, get at least 1,200 mg of calcium and at least 600 mg of vitamin D per day.  If you are older than age 47, get at least 1,200 mg of calcium and at least 800 mg of vitamin D per day. Smoking and excessive alcohol intake increase the risk of osteoporosis. Eat foods that are rich in calcium and vitamin D, and do weight-bearing exercises several times each week as directed by your health care provider. WHAT SHOULD I KNOW ABOUT HOW MENOPAUSE AFFECTS Courtney Nash? Depression may occur at any age, but it is more common as you become older. Common symptoms of depression include:  Low or sad mood.  Changes in sleep patterns.  Changes  in appetite or eating patterns.  Feeling an overall lack of motivation or enjoyment of activities that you previously enjoyed.  Frequent crying spells. Talk with your health care provider if you think that you are experiencing depression. WHAT SHOULD I KNOW ABOUT IMMUNIZATIONS? It is important that you get and maintain your immunizations. These include:  Tetanus, diphtheria, and pertussis (Tdap) booster vaccine.  Influenza every year before the flu season begins.  Pneumonia vaccine.  Shingles vaccine. Your health care provider may also recommend other immunizations.   This information is not intended to replace advice given to you by your health care provider. Make sure you discuss any questions you have with your health care provider.   Document Released: 09/03/2005 Document Revised: 08/02/2014 Document Reviewed: 03/14/2014 Elsevier Interactive Patient Education 2016 Elsevier  Inc[employer] Fat and Cholesterol Restricted Diet High levels of fat and cholesterol in your blood may lead to various health problems, such as diseases of the heart, blood vessels, gallbladder, liver, and pancreas. Fats are concentrated sources of energy that come in various forms. Certain types of fat, including saturated fat, may be harmful in excess. Cholesterol is a substance needed by your body in small amounts. Your body makes all the cholesterol it needs. Excess cholesterol comes from the food you eat. When you have high levels of cholesterol and saturated fat in your blood, health problems can develop because the excess fat and cholesterol will gather along the walls of your blood vessels, causing them to narrow. Choosing the right foods will help you control your intake of fat and cholesterol. This will help keep the levels of these substances in your blood within normal limits and reduce your risk of disease. WHAT IS MY PLAN? Your health care provider recommends that you:  Get no more than __________ % of the  total calories in your daily diet from fat.  Limit your intake of saturated fat to less than ______% of your total calories each day.  Limit the amount of cholesterol in your diet to less than _________mg per day. WHAT TYPES OF FAT SHOULD I CHOOSE?  Choose healthy fats more often. Choose monounsaturated and polyunsaturated fats, such as olive and canola oil, flaxseeds, walnuts, almonds, and seeds.  Eat more omega-3 fats. Good choices include salmon, mackerel, sardines, tuna, flaxseed oil, and ground flaxseeds. Aim to eat fish at least two times a week.  Limit saturated fats. Saturated fats are primarily found in animal products, such as meats, butter, and cream. Plant sources of saturated fats include palm oil, palm kernel oil, and coconut oil.  Avoid foods with partially hydrogenated oils in them. These contain trans fats. Examples of foods that contain trans fats are stick margarine, some tub margarines, cookies, crackers, and other baked goods. WHAT GENERAL GUIDELINES DO I NEED TO FOLLOW? These guidelines for healthy eating will help you control your intake of fat and cholesterol:  Check food labels carefully to identify foods with trans fats or high amounts of saturated fat.  Fill one half of your plate with vegetables and green salads.  Fill one fourth of your plate with whole grains. Look for the word "whole" as the first word in the ingredient list.  Fill one fourth of your plate with lean protein foods.  Limit fruit to two servings a day. Choose fruit instead of juice.  Eat more foods that contain soluble fiber. Examples of foods that contain this type of fiber are apples, broccoli, carrots, beans, peas, and barley. Aim to get 20-30 g of fiber per day.  Eat more home-cooked food and less restaurant, buffet, and fast food.  Limit or avoid alcohol.  Limit foods high in starch and sugar.  Limit fried foods.  Cook foods using methods other than frying. Baking, boiling,  grilling, and broiling are all great options.  Lose weight if you are overweight. Losing just 5-10% of your initial body weight can help your overall health and prevent diseases such as diabetes and heart disease. WHAT FOODS CAN I EAT? Grains Whole grains, such as whole wheat or whole grain breads, crackers, cereals, and pasta. Unsweetened oatmeal, bulgur, barley, quinoa, or brown rice. Corn or whole wheat flour tortillas. Vegetables Fresh or frozen vegetables (raw, steamed, roasted, or grilled). Green salads. Fruits All fresh, canned (in natural juice), or frozen fruits.  Meat and Other Protein Products Ground beef (85% or leaner), grass-fed beef, or beef trimmed of fat. Skinless chicken or [place]. Ground chicken or [place]. Pork trimmed of fat. All fish and seafood. Eggs. Dried beans, peas, or lentils. Unsalted nuts or seeds. Unsalted canned or dry beans. Dairy Low-fat dairy products, such as skim or 1% milk, 2% or reduced-fat cheeses, low-fat ricotta or cottage cheese, or plain low-fat yogurt. Fats and Oils Tub margarines without trans fats. Light or reduced-fat mayonnaise and salad dressings. Avocado. Olive, canola, sesame, or safflower oils. Natural peanut or almond butter (choose ones without added sugar and oil). The items listed above may not be a complete list of recommended foods or beverages. Contact your dietitian for more options. WHAT FOODS ARE NOT RECOMMENDED? Grains White bread. White pasta. White rice. Cornbread. Bagels, pastries, and croissants. Crackers that contain trans fat. Vegetables White potatoes. Corn. Creamed or fried vegetables. Vegetables in a cheese sauce. Fruits Dried fruits. Canned fruit in light or heavy syrup. Fruit juice. Meat and Other Protein Products Fatty cuts of meat. Ribs, chicken wings, bacon, sausage, bologna, salami, chitterlings, fatback, hot dogs, bratwurst, and packaged luncheon meats. Liver and organ meats. Dairy Whole or 2% milk, cream,  half-and-half, and cream cheese. Whole milk cheeses. Whole-fat or sweetened yogurt. Full-fat cheeses. Nondairy creamers and whipped toppings. Processed cheese, cheese spreads, or cheese curds. Sweets and Desserts Corn syrup, sugars, honey, and molasses. Candy. Jam and jelly. Syrup. Sweetened cereals. Cookies, pies, cakes, donuts, muffins, and ice cream. Fats and Oils Butter, stick margarine, lard, shortening, ghee, or bacon fat. Coconut, palm kernel, or palm oils. Beverages Alcohol. Sweetened drinks (such as sodas, lemonade, and fruit drinks or punches). The items listed above may not be a complete list of foods and beverages to avoid. Contact your dietitian for more information.   This information is not intended to replace advice given to you by your health care provider. Make sure you discuss any questions you have with your health care provider.   Document Released: 07/12/2005 Document Revised: 08/02/2014 Document Reviewed: 10/10/2013 Elsevier Interactive Patient Education [employer].

## 2016-05-12 NOTE — Progress Notes (Signed)
Courtney Nash 08/23/1940 [id number]    History:    Presents for breast and pelvic exam. Is menopausal on no HRT with no bleeding. Normal Pap and mammogram history. Osteopenia with elevated FRAX on Fosamax since 04/2013. Did show improvement on DEXA in 2015. History of ankle reconstruction from fall in 2011, had a fall several weeks ago with no fractures. Primary care manages hypercholesterolemia. 2013 negative colonoscopy. Current on vaccines. Not sexually active. History of migraines, continues with occasional headaches rare migraine.   Past medical history, past surgical history, family history and social history were all reviewed and documented in the EPIC chart. One child and 3 grandchildren all doing well.  ROS:  A ROS was performed and pertinent positives and negatives are included.  Exam:  Vitals:   05/12/16 1034  BP: 124/80  Weight: 171 lb (77.6 kg)  Height: 5\' 6"  (1.676 m)   Body mass index is 27.6 kg/m.   General appearance:  Normal Thyroid:  Symmetrical, normal in size, without palpable masses or nodularity. Respiratory  Auscultation:  Clear without wheezing or rhonchi Cardiovascular  Auscultation:  Regular rate, without rubs, murmurs or gallops  Edema/varicosities:  Not grossly evident Abdominal  Soft,nontender, without masses, guarding or rebound.  Liver/spleen:  No organomegaly noted  Hernia:  None appreciated  Skin  Inspection:  Grossly normal   Breasts: Examined lying and sitting.     Right: Without masses, retractions, discharge or axillary adenopathy.     Left: Without masses, retractions, discharge or axillary adenopathy. Gentitourinary   Inguinal/mons:  Normal without inguinal adenopathy  External genitalia:  Normal  BUS/Urethra/Skene's glands:  Normal  Vagina:  Atrophic  Cervix:  Normal  Uterus:   normal in size, shape and contour.  Midline and mobile  Adnexa/parametria:     Rt: Without masses or tenderness.   Lt: Without masses or  tenderness.  Anus and perineum: Normal  Digital rectal exam: Normal sphincter tone without palpated masses or tenderness  Assessment/Plan:  75 y.o. MWF G1 P1 for breast and pelvic exam with no complaints.  Osteopenia with elevated FRAX on Fosamax Postmenopausal/no HRT and no bleeding  Asymptomatic vaginal atrophy Hypercholesteremia-primary care manages labs and meds  Plan: Fosamax 70 mg prescription, proper use, repeat DEXA will schedule. Reviewed best to stay on for 5 years, come off if dental work needs to be done. SBE's, continue annual screening mammogram. Home safety, fall prevention and importance of weightbearing exercise reviewed.  Harrington ChallengerYOUNG,Courtney Nash WHNP, 11:20 AM 05/12/2016

## 2016-05-13 ENCOUNTER — Other Ambulatory Visit: Payer: Self-pay | Admitting: Women's Health

## 2016-06-02 ENCOUNTER — Other Ambulatory Visit (HOSPITAL_COMMUNITY): Payer: Self-pay | Admitting: Gastroenterology

## 2016-06-02 DIAGNOSIS — R112 Nausea with vomiting, unspecified: Secondary | ICD-10-CM

## 2016-06-07 ENCOUNTER — Other Ambulatory Visit: Payer: Self-pay | Admitting: Women's Health

## 2016-06-07 ENCOUNTER — Other Ambulatory Visit: Payer: Self-pay | Admitting: Gynecology

## 2016-06-07 DIAGNOSIS — M8580 Other specified disorders of bone density and structure, unspecified site: Secondary | ICD-10-CM

## 2016-06-08 ENCOUNTER — Ambulatory Visit (INDEPENDENT_AMBULATORY_CARE_PROVIDER_SITE_OTHER): Payer: Medicare Other

## 2016-06-08 DIAGNOSIS — M8589 Other specified disorders of bone density and structure, multiple sites: Secondary | ICD-10-CM | POA: Diagnosis not present

## 2016-06-08 DIAGNOSIS — M8580 Other specified disorders of bone density and structure, unspecified site: Secondary | ICD-10-CM

## 2016-06-15 ENCOUNTER — Other Ambulatory Visit (HOSPITAL_COMMUNITY): Payer: Medicare Other

## 2016-06-15 ENCOUNTER — Other Ambulatory Visit (HOSPITAL_COMMUNITY): Payer: Self-pay | Admitting: Gastroenterology

## 2016-06-15 ENCOUNTER — Ambulatory Visit (HOSPITAL_COMMUNITY)
Admission: RE | Admit: 2016-06-15 | Discharge: 2016-06-15 | Disposition: A | Discharge status: Home / Self Care | Payer: Medicare Other | Source: Ambulatory Visit | Attending: Gastroenterology | Admitting: Gastroenterology

## 2016-06-15 DIAGNOSIS — K219 Gastro-esophageal reflux disease without esophagitis: Secondary | ICD-10-CM | POA: Insufficient documentation

## 2016-06-15 DIAGNOSIS — R112 Nausea with vomiting, unspecified: Secondary | ICD-10-CM

## 2016-07-02 ENCOUNTER — Telehealth: Payer: Self-pay | Admitting: *Deleted

## 2016-07-02 NOTE — Telephone Encounter (Signed)
Telephone call, instructed to stay off 2 months and then resume after inflammation is resolved if inflammation returns will try different medication or possibly Prolia. Has been on Fosamax 3 years reviewed best to stay on 5. States is feeling well without problem. Currently on  Prilosec.

## 2016-07-02 NOTE — Telephone Encounter (Signed)
Patient has been off fosamax 70mg  x 3 weeks now,due to her PCP he order imaging which showed her esophagus showed inflammation so he told her to stop fosamax and start Prilosec has 3 weeks left. Pt asked if okay to start back on fosamax while taking Prilosec? Please advise

## 2016-09-15 ENCOUNTER — Ambulatory Visit
Admission: RE | Admit: 2016-09-15 | Discharge: 2016-09-15 | Disposition: A | Discharge status: Home / Self Care | Payer: Medicare Other | Source: Ambulatory Visit | Attending: Gastroenterology | Admitting: Gastroenterology

## 2016-09-15 ENCOUNTER — Other Ambulatory Visit: Payer: Self-pay | Admitting: Gastroenterology

## 2016-09-15 DIAGNOSIS — M5432 Sciatica, left side: Secondary | ICD-10-CM

## 2016-12-08 ENCOUNTER — Encounter: Payer: Self-pay | Admitting: Gynecology

## 2017-03-03 ENCOUNTER — Other Ambulatory Visit: Payer: Self-pay | Admitting: Women's Health

## 2017-03-03 DIAGNOSIS — Z1231 Encounter for screening mammogram for malignant neoplasm of breast: Secondary | ICD-10-CM

## 2017-03-09 ENCOUNTER — Ambulatory Visit (INDEPENDENT_AMBULATORY_CARE_PROVIDER_SITE_OTHER): Payer: Medicare Other | Admitting: Women's Health

## 2017-03-09 ENCOUNTER — Encounter: Payer: Self-pay | Admitting: Women's Health

## 2017-03-09 VITALS — BP 124/78

## 2017-03-09 DIAGNOSIS — F418 Other specified anxiety disorders: Secondary | ICD-10-CM | POA: Diagnosis not present

## 2017-03-09 DIAGNOSIS — M8589 Other specified disorders of bone density and structure, multiple sites: Secondary | ICD-10-CM

## 2017-03-09 MED ORDER — ALENDRONATE SODIUM 70 MG PO TABS: 70.0000 mg | ORAL_TABLET | ORAL | 4 refills | Status: DC

## 2017-03-09 MED ORDER — VENLAFAXINE HCL ER 37.5 MG PO CP24: 37.5000 mg | ORAL_CAPSULE | Freq: Every day | ORAL | 12 refills | Status: DC

## 2017-03-09 NOTE — Progress Notes (Signed)
Presents with feeling sad, tearfulness most days, had been on Effexor 75mg  for hot flushes but states stopped Effexor since no longer having any. Husband has A. fib and not wanting to travel via plane or car or do much of anything. Would like to visit with grandchildren more often but is not always included. States is aware that she has not much to complain about but does feel sad most days. States golden years non exactly golden, no feelings of harming self or others. States has no financial concerns although does owe to the IRS. Postmenopausal on no HRT with no bleeding. No intercourse, husbands health.  Exam: Appears well, nicely dressed, well-groomed, tearful.  Mild depression  Plan: Options reviewed, denies need for counseling, will try a lower dose of  Effexor 37.5 prescription, proper use given and reviewed. Reviewed may need to increase dose, will start there, reviewed importance of leisure activities, exercise, possibly planning bus trips.

## 2017-03-09 NOTE — Patient Instructions (Signed)
Persistent Depressive Disorder Persistent depressive disorder (PDD) is a mental health condition. PDD causes symptoms of low-level depression for 2 years or longer. It may also be called long-term (chronic) depression or dysthymia. PDD may include episodes of more severe depression that last for about 2 weeks (major depressive disorder or MDD). PDD can affect the way you think, feel, and sleep. This condition may also affect your relationships. You may be more likely to get sick if you have PDD. Symptoms of PDD occur for most of the day and may include:  Feeling tired (fatigue).  Low energy.  Eating too much or too little.  Sleeping too much or too little.  Feeling restless or agitated.  Feeling hopeless.  Feeling worthless or guilty.  Feeling worried or nervous (anxiety).  Trouble concentrating or making decisions.  Low self-esteem.  A negative way of looking at things (outlook).  Not being able to have fun or feel pleasure.  Avoiding interacting with people.  Getting angry or annoyed easily (irritability).  Acting aggressive or angry.  Follow these instructions at home: Activity  Go back to your normal activities as told by your doctor.  Exercise regularly as told by your doctor. General instructions  Take over-the-counter and prescription medicines only as told by your doctor.  Do not drink alcohol. Or, limit how much alcohol you drink to no more than 1 drink a day for nonpregnant women and 2 drinks a day for men. One drink equals 12 oz of beer, 5 oz of wine, or 1 oz of hard liquor. Alcohol can affect any antidepressant medicines you are taking. Talk with your doctor about your alcohol use.  Eat a healthy diet and get plenty of sleep.  Find activities that you enjoy each day.  Consider joining a support group. Your doctor may be able to suggest a support group.  Keep all follow-up visits as told by your doctor. This is important. Where to find more  information: National Alliance on Mental Illness  www.nami.org  [place] National Institute of Mental Health  www.nimh.nih.gov  National Suicide Prevention Lifeline  1-800-273-TALK (1-800-273-8255). This is free, 24-hour help.  Contact a doctor if:  Your symptoms get worse.  You have new symptoms.  You have trouble sleeping or doing your daily activities. Get help right away if:  You self-harm.  You have serious thoughts about hurting yourself or others.  You see, hear, taste, smell, or feel things that are not there (hallucinate). This information is not intended to replace advice given to you by your health care provider. Make sure you discuss any questions you have with your health care provider. Document Released: 06/23/2015 Document Revised: 03/05/2016 Document Reviewed: 03/05/2016 Elsevier Interactive Patient Education  2017 Elsevier Inc.  

## 2017-04-11 ENCOUNTER — Ambulatory Visit
Admission: RE | Admit: 2017-04-11 | Discharge: 2017-04-11 | Disposition: A | Discharge status: Home / Self Care | Payer: Medicare Other | Source: Ambulatory Visit | Attending: Women's Health | Admitting: Women's Health

## 2017-04-11 ENCOUNTER — Encounter: Payer: Self-pay | Admitting: Women's Health

## 2017-04-11 DIAGNOSIS — Z1231 Encounter for screening mammogram for malignant neoplasm of breast: Secondary | ICD-10-CM

## 2017-05-30 ENCOUNTER — Ambulatory Visit (INDEPENDENT_AMBULATORY_CARE_PROVIDER_SITE_OTHER): Payer: Medicare Other | Admitting: Women's Health

## 2017-05-30 ENCOUNTER — Encounter: Payer: Self-pay | Admitting: Women's Health

## 2017-05-30 VITALS — BP 116/74 | Ht 66.0 in | Wt 169.0 lb

## 2017-05-30 DIAGNOSIS — F418 Other specified anxiety disorders: Secondary | ICD-10-CM

## 2017-05-30 DIAGNOSIS — M8589 Other specified disorders of bone density and structure, multiple sites: Secondary | ICD-10-CM

## 2017-05-30 DIAGNOSIS — Z01419 Encounter for gynecological examination (general) (routine) without abnormal findings: Secondary | ICD-10-CM | POA: Diagnosis not present

## 2017-05-30 MED ORDER — VENLAFAXINE HCL ER 75 MG PO CP24: 75.0000 mg | ORAL_CAPSULE | Freq: Every day | ORAL | 4 refills | Status: AC

## 2017-05-30 MED ORDER — ALENDRONATE SODIUM 70 MG PO TABS: 70.0000 mg | ORAL_TABLET | ORAL | 4 refills | Status: AC

## 2017-05-30 NOTE — Progress Notes (Signed)
Courtney ElseJudith C Nash 05/22/1941 [id number]    History:    Presents for breast and pelvic exam.  Postmenopausal on no HRT with no bleeding. Primary care manages labs and meds per hypercholesterolemia. 11/2016 fractured humerus from traumatic fall. 2013 negative colonoscopy. Current on vaccinations. Osteopenia on Fosamax started 04/2013 last DEXA 2017 tolerating Fosamax. On Effexor for depression with good relief.  Past medical history, past surgical history, family history and social history were all reviewed and documented in the EPIC chart. 1 son, 3 grandchildren all doing well.  ROS:  A ROS was performed and pertinent positives and negatives are included.  Exam:  Vitals:   05/30/17 1058  BP: 116/74  Weight: 169 lb (76.7 kg)  Height: 5\' 6"  (1.676 m)   Body mass index is 27.28 kg/m.   General appearance:  Normal Thyroid:  Symmetrical, normal in size, without palpable masses or nodularity. Respiratory  Auscultation:  Clear without wheezing or rhonchi Cardiovascular  Auscultation:  Regular rate, without rubs, murmurs or gallops  Edema/varicosities:  Not grossly evident Abdominal  Soft,nontender, without masses, guarding or rebound.  Liver/spleen:  No organomegaly noted  Hernia:  None appreciated  Skin  Inspection:  Grossly normal   Breasts: Examined lying and sitting.     Right: Without masses, retractions, discharge or axillary adenopathy.     Left: Without masses, retractions, discharge or axillary adenopathy. Gentitourinary   Inguinal/mons:  Normal without inguinal adenopathy  External genitalia:  Normal  BUS/Urethra/Skene's glands:  Normal  Vagina:  Normal  Cervix:  Normal  Uterus:  normal in size, shape and contour.  Midline and mobile  Adnexa/parametria:     Rt: Without masses or tenderness.   Lt: Without masses or tenderness.  Anus and perineum: Normal  Digital rectal exam: Normal sphincter tone without palpated masses or tenderness  Assessment/Plan:  76 y.o. M WF G1  P1 for breast and pelvic exam.  Osteopenia on Fosamax since 04/2013 Low-grade depression stable on Effexor Postmenopausal/no HRT/no bleeding Hypercholesterolemia-primary care manages labs and meds  Plan: Fosamax 70 mg weekly prescription, proper use given and reviewed. Repeat DEXA next year. Home safety, fall prevention and importance of weightbearing exercise reviewed. Effexor 75 mg by mouth daily, reviewed importance of self-care, leisure activities. SBE's, continue annual screening mammogram, calcium rich diet, vitamin D 2000 daily encouraged.   Harrington Challengerancy J Wayman Hoard [facility] CenterWHNP, 1:45 PM 05/30/2017

## 2017-05-30 NOTE — Patient Instructions (Signed)
Health Maintenance for Postmenopausal Women Menopause is a normal process in which your reproductive ability comes to an end. This process happens gradually over a span of months to years, usually between the ages of 22 and 9. Menopause is complete when you have missed 12 consecutive menstrual periods. It is important to talk with your health care provider about some of the most common conditions that affect postmenopausal women, such as heart disease, cancer, and bone loss (osteoporosis). Adopting a healthy lifestyle and getting preventive care can help to promote your health and wellness. Those actions can also lower your chances of developing some of these common conditions. What should I know about menopause? During menopause, you may experience a number of symptoms, such as:  Moderate-to-severe hot flashes.  Night sweats.  Decrease in sex drive.  Mood swings.  Headaches.  Tiredness.  Irritability.  Memory problems.  Insomnia.  Choosing to treat or not to treat menopausal changes is an individual decision that you make with your health care provider. What should I know about hormone replacement therapy and supplements? Hormone therapy products are effective for treating symptoms that are associated with menopause, such as hot flashes and night sweats. Hormone replacement carries certain risks, especially as you become older. If you are thinking about using estrogen or estrogen with progestin treatments, discuss the benefits and risks with your health care provider. What should I know about heart disease and stroke? Heart disease, heart attack, and stroke become more likely as you age. This may be due, in part, to the hormonal changes that your body experiences during menopause. These can affect how your body processes dietary fats, triglycerides, and cholesterol. Heart attack and stroke are both medical emergencies. There are many things that you can do to help prevent heart disease  and stroke:  Have your blood pressure checked at least every 1-2 years. High blood pressure causes heart disease and increases the risk of stroke.  If you are 53-22 years old, ask your health care provider if you should take aspirin to prevent a heart attack or a stroke.  Do not use any tobacco products, including cigarettes, chewing tobacco, or electronic cigarettes. If you need help quitting, ask your health care provider.  It is important to eat a healthy diet and maintain a healthy weight. ? Be sure to include plenty of vegetables, fruits, low-fat dairy products, and lean protein. ? Avoid eating foods that are high in solid fats, added sugars, or salt (sodium).  Get regular exercise. This is one of the most important things that you can do for your health. ? Try to exercise for at least 150 minutes each week. The type of exercise that you do should increase your heart rate and make you sweat. This is known as moderate-intensity exercise. ? Try to do strengthening exercises at least twice each week. Do these in addition to the moderate-intensity exercise.  Know your numbers.Ask your health care provider to check your cholesterol and your blood glucose. Continue to have your blood tested as directed by your health care provider.  What should I know about cancer screening? There are several types of cancer. Take the following steps to reduce your risk and to catch any cancer development as early as possible. Breast Cancer  Practice breast self-awareness. ? This means understanding how your breasts normally appear and feel. ? It also means doing regular breast self-exams. Let your health care provider know about any changes, no matter how small.  If you are 40  or older, have a clinician do a breast exam (clinical breast exam or CBE) every year. Depending on your age, family history, and medical history, it may be recommended that you also have a yearly breast X-ray (mammogram).  If you  have a family history of breast cancer, talk with your health care provider about genetic screening.  If you are at high risk for breast cancer, talk with your health care provider about having an MRI and a mammogram every year.  Breast cancer (BRCA) gene test is recommended for women who have family members with BRCA-related cancers. Results of the assessment will determine the need for genetic counseling and BRCA1 and for BRCA2 testing. BRCA-related cancers include these types: ? Breast. This occurs in males or females. ? Ovarian. ? Tubal. This may also be called fallopian tube cancer. ? Cancer of the abdominal or pelvic lining (peritoneal cancer). ? Prostate. ? Pancreatic.  Cervical, Uterine, and Ovarian Cancer Your health care provider may recommend that you be screened regularly for cancer of the pelvic organs. These include your ovaries, uterus, and vagina. This screening involves a pelvic exam, which includes checking for microscopic changes to the surface of your cervix (Pap test).  For women ages 21-65, health care providers may recommend a pelvic exam and a Pap test every three years. For women ages 79-65, they may recommend the Pap test and pelvic exam, combined with testing for human papilloma virus (HPV), every five years. Some types of HPV increase your risk of cervical cancer. Testing for HPV may also be done on women of any age who have unclear Pap test results.  Other health care providers may not recommend any screening for nonpregnant women who are considered low risk for pelvic cancer and have no symptoms. Ask your health care provider if a screening pelvic exam is right for you.  If you have had past treatment for cervical cancer or a condition that could lead to cancer, you need Pap tests and screening for cancer for at least 20 years after your treatment. If Pap tests have been discontinued for you, your risk factors (such as having a new sexual partner) need to be  reassessed to determine if you should start having screenings again. Some women have medical problems that increase the chance of getting cervical cancer. In these cases, your health care provider may recommend that you have screening and Pap tests more often.  If you have a family history of uterine cancer or ovarian cancer, talk with your health care provider about genetic screening.  If you have vaginal bleeding after reaching menopause, tell your health care provider.  There are currently no reliable tests available to screen for ovarian cancer.  Lung Cancer Lung cancer screening is recommended for adults 69-62 years old who are at high risk for lung cancer because of a history of smoking. A yearly low-dose CT scan of the lungs is recommended if you:  Currently smoke.  Have a history of at least 30 pack-years of smoking and you currently smoke or have quit within the past 15 years. A pack-year is smoking an average of one pack of cigarettes per day for one year.  Yearly screening should:  Continue until it has been 15 years since you quit.  Stop if you develop a health problem that would prevent you from having lung cancer treatment.  Colorectal Cancer  This type of cancer can be detected and can often be prevented.  Routine colorectal cancer screening usually begins at  age 42 and continues through age 45.  If you have risk factors for colon cancer, your health care provider may recommend that you be screened at an earlier age.  If you have a family history of colorectal cancer, talk with your health care provider about genetic screening.  Your health care provider may also recommend using home test kits to check for hidden blood in your stool.  A small camera at the end of a tube can be used to examine your colon directly (sigmoidoscopy or colonoscopy). This is done to check for the earliest forms of colorectal cancer.  Direct examination of the colon should be repeated every  5-10 years until age 71. However, if early forms of precancerous polyps or small growths are found or if you have a family history or genetic risk for colorectal cancer, you may need to be screened more often.  Skin Cancer  Check your skin from head to toe regularly.  Monitor any moles. Be sure to tell your health care provider: ? About any new moles or changes in moles, especially if there is a change in a mole's shape or color. ? If you have a mole that is larger than the size of a pencil eraser.  If any of your family members has a history of skin cancer, especially at a Meagan Ancona age, talk with your health care provider about genetic screening.  Always use sunscreen. Apply sunscreen liberally and repeatedly throughout the day.  Whenever you are outside, protect yourself by wearing long sleeves, pants, a wide-brimmed hat, and sunglasses.  What should I know about osteoporosis? Osteoporosis is a condition in which bone destruction happens more quickly than new bone creation. After menopause, you may be at an increased risk for osteoporosis. To help prevent osteoporosis or the bone fractures that can happen because of osteoporosis, the following is recommended:  If you are 46-71 years old, get at least 1,000 mg of calcium and at least 600 mg of vitamin D per day.  If you are older than age 55 but younger than age 65, get at least 1,200 mg of calcium and at least 600 mg of vitamin D per day.  If you are older than age 54, get at least 1,200 mg of calcium and at least 800 mg of vitamin D per day.  Smoking and excessive alcohol intake increase the risk of osteoporosis. Eat foods that are rich in calcium and vitamin D, and do weight-bearing exercises several times each week as directed by your health care provider. What should I know about how menopause affects my mental health? Depression may occur at any age, but it is more common as you become older. Common symptoms of depression  include:  Low or sad mood.  Changes in sleep patterns.  Changes in appetite or eating patterns.  Feeling an overall lack of motivation or enjoyment of activities that you previously enjoyed.  Frequent crying spells.  Talk with your health care provider if you think that you are experiencing depression. What should I know about immunizations? It is important that you get and maintain your immunizations. These include:  Tetanus, diphtheria, and pertussis (Tdap) booster vaccine.  Influenza every year before the flu season begins.  Pneumonia vaccine.  Shingles vaccine.  Your health care provider may also recommend other immunizations. This information is not intended to replace advice given to you by your health care provider. Make sure you discuss any questions you have with your health care provider. Document Released: 09/03/2005  Document Revised: 01/30/2016 Document Reviewed: 04/15/2015 Elsevier Interactive Patient Education  [employer].

## 2018-04-10 ENCOUNTER — Other Ambulatory Visit: Payer: Self-pay | Admitting: Women's Health

## 2018-04-10 DIAGNOSIS — Z1231 Encounter for screening mammogram for malignant neoplasm of breast: Secondary | ICD-10-CM

## 2018-04-13 ENCOUNTER — Ambulatory Visit
Admission: RE | Admit: 2018-04-13 | Discharge: 2018-04-13 | Disposition: A | Discharge status: Home / Self Care | Payer: Medicare Other | Source: Ambulatory Visit | Attending: Women's Health | Admitting: Women's Health

## 2018-04-13 DIAGNOSIS — Z1231 Encounter for screening mammogram for malignant neoplasm of breast: Secondary | ICD-10-CM

## 2018-05-31 ENCOUNTER — Encounter: Payer: Self-pay | Admitting: Women's Health

## 2018-05-31 ENCOUNTER — Ambulatory Visit: Payer: Medicare Other | Admitting: Women's Health

## 2018-05-31 VITALS — BP 130/82 | Ht 66.0 in | Wt 170.0 lb

## 2018-05-31 DIAGNOSIS — Z13820 Encounter for screening for osteoporosis: Secondary | ICD-10-CM

## 2018-05-31 DIAGNOSIS — Z01419 Encounter for gynecological examination (general) (routine) without abnormal findings: Secondary | ICD-10-CM | POA: Diagnosis not present

## 2018-05-31 NOTE — Progress Notes (Signed)
Courtney Nash [birth year] [id number]    History:    Presents for breast and pelvic exam with no complaints.  Postmenopausal on no HRT with no bleeding.  Normal Pap and mammogram history.  2013- colonoscopy.  Vaccines current . history of osteoporosis started on Fosamax 04/2013 has completed 5 years.  11/2016 broke arm after traumatic fall.  Primary care manages labs and meds.  Not sexually active husband's health, recovering from prostate cancer recently completed 40 radiation treatments.  Past medical history, past surgical history, family history and social history were all reviewed and documented in the EPIC chart.  One son and 3 grandchildren ages 63-9.  ROS:  A ROS was performed and pertinent positives and negatives are included.  Exam:  Vitals:   05/31/18 1120  BP: 130/82  Weight: 170 lb (77.1 kg)  Height: 5\' 6"  (1.676 m)   Body mass index is 27.44 kg/m.   General appearance:  Normal Thyroid:  Symmetrical, normal in size, without palpable masses or nodularity. Respiratory  Auscultation:  Clear without wheezing or rhonchi Cardiovascular  Auscultation:  Regular rate, without rubs, murmurs or gallops  Edema/varicosities:  Not grossly evident Abdominal  Soft,nontender, without masses, guarding or rebound.  Liver/spleen:  No organomegaly noted  Hernia:  None appreciated  Skin  Inspection:  Grossly normal   Breasts: Examined lying and sitting.     Right: Without masses, retractions, discharge or axillary adenopathy.     Left: Without masses, retractions, discharge or axillary adenopathy. Gentitourinary   Inguinal/mons:  Normal without inguinal adenopathy  External genitalia:  Normal  BUS/Urethra/Skene's glands:  Normal  Vagina: Atrophic  Cervix:  Normal  Uterus:   normal in size, shape and contour.  Midline and mobile  Adnexa/parametria:     Rt: Without masses or tenderness.   Lt: Without masses or tenderness.  Anus and perineum: Normal  Digital rectal exam: Normal  sphincter tone without palpated masses or tenderness  Assessment/Plan:  77 y.o. MWF G1, P1 for breast and pelvic exam with no complaints.  Postmenopausal/no HRT/no bleeding Osteoporosis completed 5 years of Fosamax 04/2018 Hypercholesteremia, depression primary care manages labs and meds  Plan: SBE's, continue annual screening mammogram, calcium rich foods, vitamin D 2000 daily encouraged.  Home safety, fall prevention discussed.  Reviewed importance of increasing regular weightbearing and balance type exercise, yoga encouraged.  Repeat DEXA.  Reviewed has completed 5 years of Fosamax will stop.    [name] [facility], 12:24 PM 05/31/2018

## 2018-05-31 NOTE — Patient Instructions (Signed)
Health Maintenance for Postmenopausal Women Menopause is a normal process in which your reproductive ability comes to an end. This process happens gradually over a span of months to years, usually between the ages of 22 and 9. Menopause is complete when you have missed 12 consecutive menstrual periods. It is important to talk with your health care provider about some of the most common conditions that affect postmenopausal women, such as heart disease, cancer, and bone loss (osteoporosis). Adopting a healthy lifestyle and getting preventive care can help to promote your health and wellness. Those actions can also lower your chances of developing some of these common conditions. What should I know about menopause? During menopause, you may experience a number of symptoms, such as:  Moderate-to-severe hot flashes.  Night sweats.  Decrease in sex drive.  Mood swings.  Headaches.  Tiredness.  Irritability.  Memory problems.  Insomnia.  Choosing to treat or not to treat menopausal changes is an individual decision that you make with your health care provider. What should I know about hormone replacement therapy and supplements? Hormone therapy products are effective for treating symptoms that are associated with menopause, such as hot flashes and night sweats. Hormone replacement carries certain risks, especially as you become older. If you are thinking about using estrogen or estrogen with progestin treatments, discuss the benefits and risks with your health care provider. What should I know about heart disease and stroke? Heart disease, heart attack, and stroke become more likely as you age. This may be due, in part, to the hormonal changes that your body experiences during menopause. These can affect how your body processes dietary fats, triglycerides, and cholesterol. Heart attack and stroke are both medical emergencies. There are many things that you can do to help prevent heart disease  and stroke:  Have your blood pressure checked at least every 1-2 years. High blood pressure causes heart disease and increases the risk of stroke.  If you are 53-22 years old, ask your health care provider if you should take aspirin to prevent a heart attack or a stroke.  Do not use any tobacco products, including cigarettes, chewing tobacco, or electronic cigarettes. If you need help quitting, ask your health care provider.  It is important to eat a healthy diet and maintain a healthy weight. ? Be sure to include plenty of vegetables, fruits, low-fat dairy products, and lean protein. ? Avoid eating foods that are high in solid fats, added sugars, or salt (sodium).  Get regular exercise. This is one of the most important things that you can do for your health. ? Try to exercise for at least 150 minutes each week. The type of exercise that you do should increase your heart rate and make you sweat. This is known as moderate-intensity exercise. ? Try to do strengthening exercises at least twice each week. Do these in addition to the moderate-intensity exercise.  Know your numbers.Ask your health care provider to check your cholesterol and your blood glucose. Continue to have your blood tested as directed by your health care provider.  What should I know about cancer screening? There are several types of cancer. Take the following steps to reduce your risk and to catch any cancer development as early as possible. Breast Cancer  Practice breast self-awareness. ? This means understanding how your breasts normally appear and feel. ? It also means doing regular breast self-exams. Let your health care provider know about any changes, no matter how small.  If you are 40  or older, have a clinician do a breast exam (clinical breast exam or CBE) every year. Depending on your age, family history, and medical history, it may be recommended that you also have a yearly breast X-ray (mammogram).  If you  have a family history of breast cancer, talk with your health care provider about genetic screening.  If you are at high risk for breast cancer, talk with your health care provider about having an MRI and a mammogram every year.  Breast cancer (BRCA) gene test is recommended for women who have family members with BRCA-related cancers. Results of the assessment will determine the need for genetic counseling and BRCA1 and for BRCA2 testing. BRCA-related cancers include these types: ? Breast. This occurs in males or females. ? Ovarian. ? Tubal. This may also be called fallopian tube cancer. ? Cancer of the abdominal or pelvic lining (peritoneal cancer). ? Prostate. ? Pancreatic.  Cervical, Uterine, and Ovarian Cancer Your health care provider may recommend that you be screened regularly for cancer of the pelvic organs. These include your ovaries, uterus, and vagina. This screening involves a pelvic exam, which includes checking for microscopic changes to the surface of your cervix (Pap test).  For women ages 21-65, health care providers may recommend a pelvic exam and a Pap test every three years. For women ages 79-65, they may recommend the Pap test and pelvic exam, combined with testing for human papilloma virus (HPV), every five years. Some types of HPV increase your risk of cervical cancer. Testing for HPV may also be done on women of any age who have unclear Pap test results.  Other health care providers may not recommend any screening for nonpregnant women who are considered low risk for pelvic cancer and have no symptoms. Ask your health care provider if a screening pelvic exam is right for you.  If you have had past treatment for cervical cancer or a condition that could lead to cancer, you need Pap tests and screening for cancer for at least 20 years after your treatment. If Pap tests have been discontinued for you, your risk factors (such as having a new sexual partner) need to be  reassessed to determine if you should start having screenings again. Some women have medical problems that increase the chance of getting cervical cancer. In these cases, your health care provider may recommend that you have screening and Pap tests more often.  If you have a family history of uterine cancer or ovarian cancer, talk with your health care provider about genetic screening.  If you have vaginal bleeding after reaching menopause, tell your health care provider.  There are currently no reliable tests available to screen for ovarian cancer.  Lung Cancer Lung cancer screening is recommended for adults 69-62 years old who are at high risk for lung cancer because of a history of smoking. A yearly low-dose CT scan of the lungs is recommended if you:  Currently smoke.  Have a history of at least 30 pack-years of smoking and you currently smoke or have quit within the past 15 years. A pack-year is smoking an average of one pack of cigarettes per day for one year.  Yearly screening should:  Continue until it has been 15 years since you quit.  Stop if you develop a health problem that would prevent you from having lung cancer treatment.  Colorectal Cancer  This type of cancer can be detected and can often be prevented.  Routine colorectal cancer screening usually begins at  age 42 and continues through age 45.  If you have risk factors for colon cancer, your health care provider may recommend that you be screened at an earlier age.  If you have a family history of colorectal cancer, talk with your health care provider about genetic screening.  Your health care provider may also recommend using home test kits to check for hidden blood in your stool.  A small camera at the end of a tube can be used to examine your colon directly (sigmoidoscopy or colonoscopy). This is done to check for the earliest forms of colorectal cancer.  Direct examination of the colon should be repeated every  5-10 years until age 71. However, if early forms of precancerous polyps or small growths are found or if you have a family history or genetic risk for colorectal cancer, you may need to be screened more often.  Skin Cancer  Check your skin from head to toe regularly.  Monitor any moles. Be sure to tell your health care provider: ? About any new moles or changes in moles, especially if there is a change in a mole's shape or color. ? If you have a mole that is larger than the size of a pencil eraser.  If any of your family members has a history of skin cancer, especially at a Lyanna Blystone age, talk with your health care provider about genetic screening.  Always use sunscreen. Apply sunscreen liberally and repeatedly throughout the day.  Whenever you are outside, protect yourself by wearing long sleeves, pants, a wide-brimmed hat, and sunglasses.  What should I know about osteoporosis? Osteoporosis is a condition in which bone destruction happens more quickly than new bone creation. After menopause, you may be at an increased risk for osteoporosis. To help prevent osteoporosis or the bone fractures that can happen because of osteoporosis, the following is recommended:  If you are 46-71 years old, get at least 1,000 mg of calcium and at least 600 mg of vitamin D per day.  If you are older than age 55 but younger than age 65, get at least 1,200 mg of calcium and at least 600 mg of vitamin D per day.  If you are older than age 54, get at least 1,200 mg of calcium and at least 800 mg of vitamin D per day.  Smoking and excessive alcohol intake increase the risk of osteoporosis. Eat foods that are rich in calcium and vitamin D, and do weight-bearing exercises several times each week as directed by your health care provider. What should I know about how menopause affects my mental health? Depression may occur at any age, but it is more common as you become older. Common symptoms of depression  include:  Low or sad mood.  Changes in sleep patterns.  Changes in appetite or eating patterns.  Feeling an overall lack of motivation or enjoyment of activities that you previously enjoyed.  Frequent crying spells.  Talk with your health care provider if you think that you are experiencing depression. What should I know about immunizations? It is important that you get and maintain your immunizations. These include:  Tetanus, diphtheria, and pertussis (Tdap) booster vaccine.  Influenza every year before the flu season begins.  Pneumonia vaccine.  Shingles vaccine.  Your health care provider may also recommend other immunizations. This information is not intended to replace advice given to you by your health care provider. Make sure you discuss any questions you have with your health care provider. Document Released: 09/03/2005  Document Revised: 01/30/2016 Document Reviewed: 04/15/2015 [employer] Interactive Patient Education  2018 [employer]  

## 2018-06-07 LAB — GLUCOSE, POCT (MANUAL RESULT ENTRY): POC Glucose: 96 mg/dl (ref 70–99)

## 2018-06-26 ENCOUNTER — Other Ambulatory Visit: Payer: Self-pay | Admitting: Obstetrics & Gynecology

## 2018-06-26 ENCOUNTER — Ambulatory Visit (INDEPENDENT_AMBULATORY_CARE_PROVIDER_SITE_OTHER): Payer: Medicare Other

## 2018-06-26 ENCOUNTER — Other Ambulatory Visit: Payer: Self-pay | Admitting: Women's Health

## 2018-06-26 DIAGNOSIS — M85852 Other specified disorders of bone density and structure, left thigh: Secondary | ICD-10-CM

## 2018-06-26 DIAGNOSIS — Z780 Asymptomatic menopausal state: Secondary | ICD-10-CM

## 2018-06-26 DIAGNOSIS — Z13820 Encounter for screening for osteoporosis: Secondary | ICD-10-CM

## 2018-06-26 DIAGNOSIS — M8588 Other specified disorders of bone density and structure, other site: Secondary | ICD-10-CM

## 2018-09-25 ENCOUNTER — Ambulatory Visit
Admission: RE | Admit: 2018-09-25 | Discharge: 2018-09-25 | Disposition: A | Discharge status: Home / Self Care | Payer: Medicare Other | Source: Ambulatory Visit | Attending: Internal Medicine | Admitting: Internal Medicine

## 2018-09-25 ENCOUNTER — Other Ambulatory Visit: Payer: Self-pay | Admitting: Internal Medicine

## 2018-09-25 DIAGNOSIS — M545 Low back pain, unspecified: Secondary | ICD-10-CM

## 2019-03-29 ENCOUNTER — Other Ambulatory Visit: Payer: Self-pay | Admitting: Internal Medicine

## 2019-03-29 DIAGNOSIS — Z1231 Encounter for screening mammogram for malignant neoplasm of breast: Secondary | ICD-10-CM

## 2019-05-16 ENCOUNTER — Ambulatory Visit
Admission: RE | Admit: 2019-05-16 | Discharge: 2019-05-16 | Disposition: A | Discharge status: Home / Self Care | Payer: Medicare Other | Source: Ambulatory Visit | Attending: Internal Medicine | Admitting: Internal Medicine

## 2019-05-16 ENCOUNTER — Other Ambulatory Visit: Payer: Self-pay

## 2019-05-16 DIAGNOSIS — Z1231 Encounter for screening mammogram for malignant neoplasm of breast: Secondary | ICD-10-CM

## 2020-04-14 ENCOUNTER — Other Ambulatory Visit: Payer: Self-pay | Admitting: Internal Medicine

## 2020-04-14 DIAGNOSIS — Z1231 Encounter for screening mammogram for malignant neoplasm of breast: Secondary | ICD-10-CM

## 2020-05-19 ENCOUNTER — Ambulatory Visit: Payer: Medicare Other

## 2020-06-23 ENCOUNTER — Ambulatory Visit
Admission: RE | Admit: 2020-06-23 | Discharge: 2020-06-23 | Disposition: A | Discharge status: Home / Self Care | Payer: Medicare Other | Source: Ambulatory Visit | Attending: Internal Medicine | Admitting: Internal Medicine

## 2020-06-23 ENCOUNTER — Other Ambulatory Visit: Payer: Self-pay

## 2020-06-23 DIAGNOSIS — Z1231 Encounter for screening mammogram for malignant neoplasm of breast: Secondary | ICD-10-CM

## 2020-07-28 ENCOUNTER — Other Ambulatory Visit: Payer: Self-pay | Admitting: Obstetrics & Gynecology

## 2020-07-28 DIAGNOSIS — Z780 Asymptomatic menopausal state: Secondary | ICD-10-CM

## 2020-07-28 DIAGNOSIS — M8589 Other specified disorders of bone density and structure, multiple sites: Secondary | ICD-10-CM

## 2020-09-09 ENCOUNTER — Ambulatory Visit (INDEPENDENT_AMBULATORY_CARE_PROVIDER_SITE_OTHER): Payer: Medicare Other

## 2020-09-09 ENCOUNTER — Other Ambulatory Visit: Payer: Self-pay

## 2020-09-09 DIAGNOSIS — Z780 Asymptomatic menopausal state: Secondary | ICD-10-CM | POA: Diagnosis not present

## 2020-09-09 DIAGNOSIS — M8589 Other specified disorders of bone density and structure, multiple sites: Secondary | ICD-10-CM

## 2021-05-25 ENCOUNTER — Other Ambulatory Visit: Payer: Self-pay | Admitting: Internal Medicine

## 2021-05-25 DIAGNOSIS — Z1231 Encounter for screening mammogram for malignant neoplasm of breast: Secondary | ICD-10-CM

## 2021-06-25 ENCOUNTER — Ambulatory Visit
Admission: RE | Admit: 2021-06-25 | Discharge: 2021-06-25 | Disposition: A | Discharge status: Home / Self Care | Payer: Medicare Other | Source: Ambulatory Visit | Attending: Internal Medicine | Admitting: Internal Medicine

## 2021-06-25 ENCOUNTER — Other Ambulatory Visit: Payer: Self-pay

## 2021-06-25 DIAGNOSIS — Z1231 Encounter for screening mammogram for malignant neoplasm of breast: Secondary | ICD-10-CM

## 2021-06-29 ENCOUNTER — Other Ambulatory Visit: Payer: Self-pay | Admitting: Internal Medicine

## 2021-06-29 DIAGNOSIS — R928 Other abnormal and inconclusive findings on diagnostic imaging of breast: Secondary | ICD-10-CM

## 2021-08-03 ENCOUNTER — Ambulatory Visit
Admission: RE | Admit: 2021-08-03 | Discharge: 2021-08-03 | Disposition: A | Discharge status: Home / Self Care | Payer: Medicare Other | Source: Ambulatory Visit | Attending: Internal Medicine | Admitting: Internal Medicine

## 2021-08-03 ENCOUNTER — Other Ambulatory Visit: Payer: Self-pay | Admitting: Internal Medicine

## 2021-08-03 ENCOUNTER — Ambulatory Visit: Payer: Medicare Other

## 2021-08-03 DIAGNOSIS — R921 Mammographic calcification found on diagnostic imaging of breast: Secondary | ICD-10-CM

## 2021-08-03 DIAGNOSIS — R928 Other abnormal and inconclusive findings on diagnostic imaging of breast: Secondary | ICD-10-CM

## 2022-01-14 IMAGING — MG MM DIGITAL DIAGNOSTIC UNILAT*L* W/ TOMO W/ CAD
4 series · 4 of 8 positions shown · non-contrast
Comparison: Previous exam(s).

CLINICAL DATA: 80-year-old female for further evaluation of LEFT
breast calcifications identified on screening mammogram.

EXAM:
DIGITAL DIAGNOSTIC UNILATERAL LEFT MAMMOGRAM WITH TOMOSYNTHESIS AND
CAD
TECHNIQUE: Left digital diagnostic mammography and breast tomosynthesis was
performed. The images were evaluated with computer-aided detection.

[L CC]
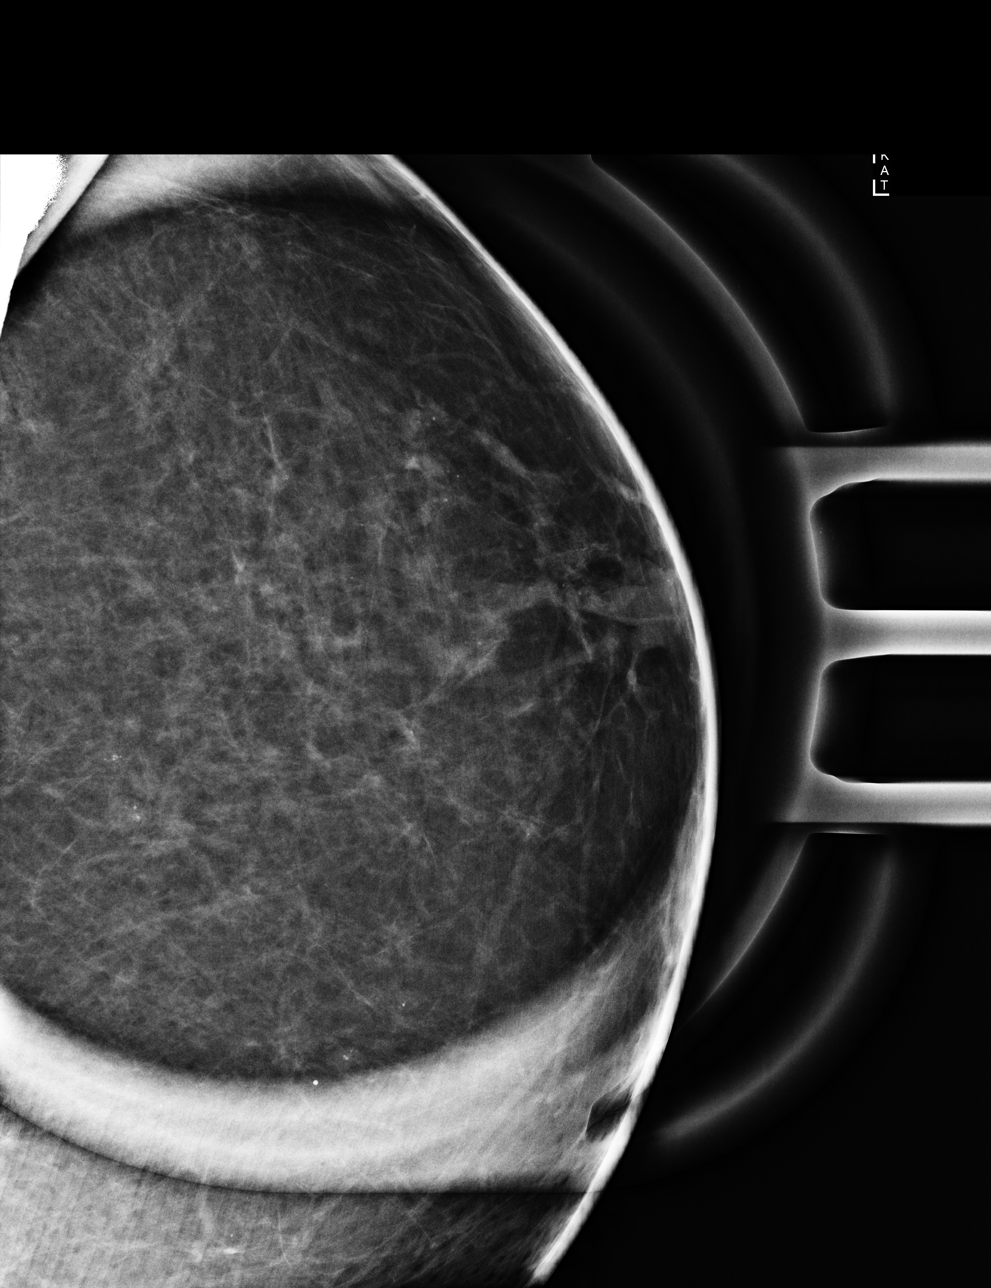

[L ML]
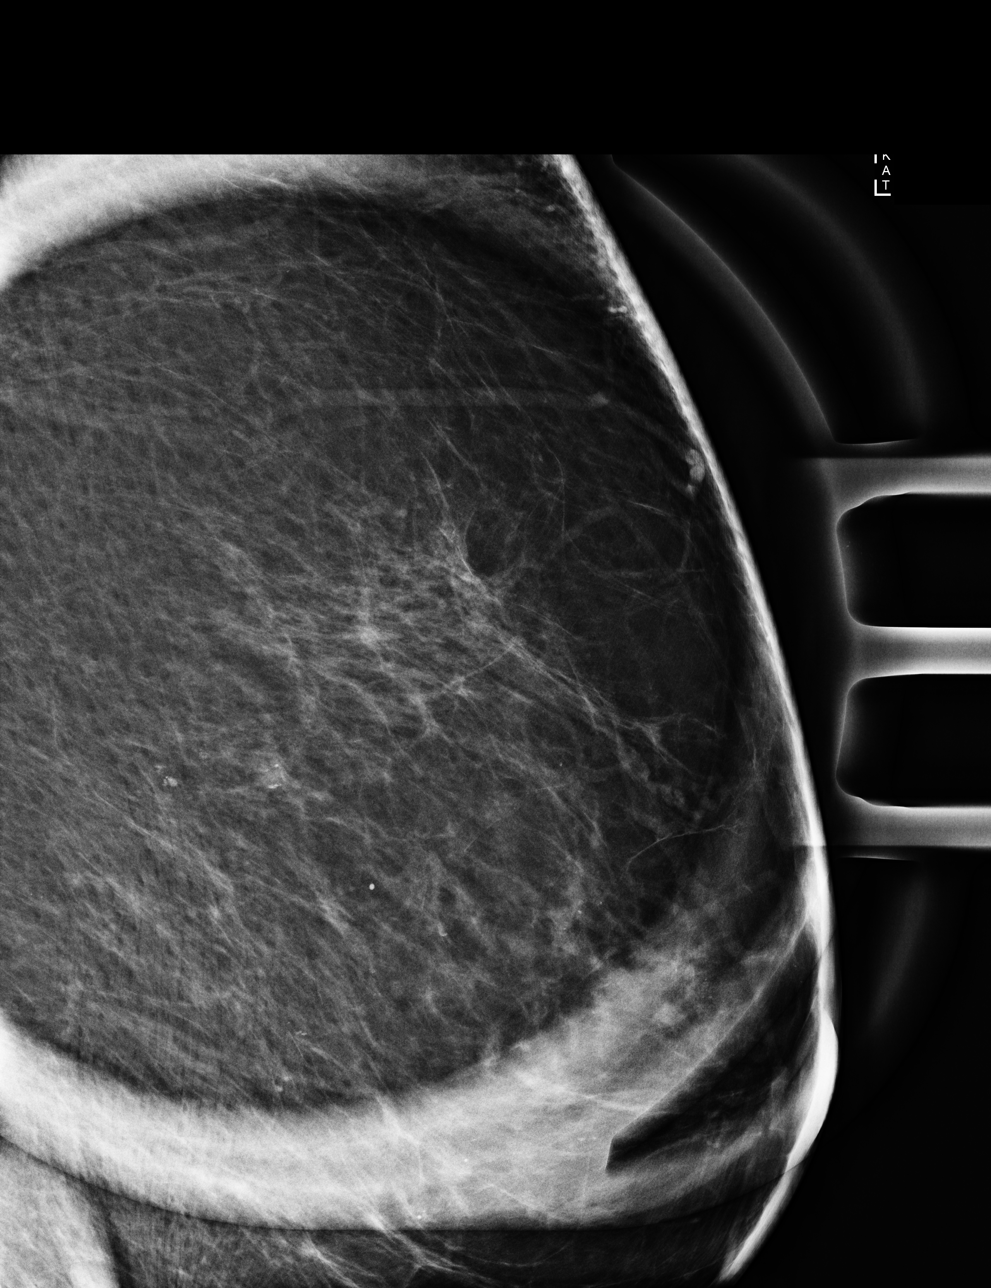

[L ML synth-2D]
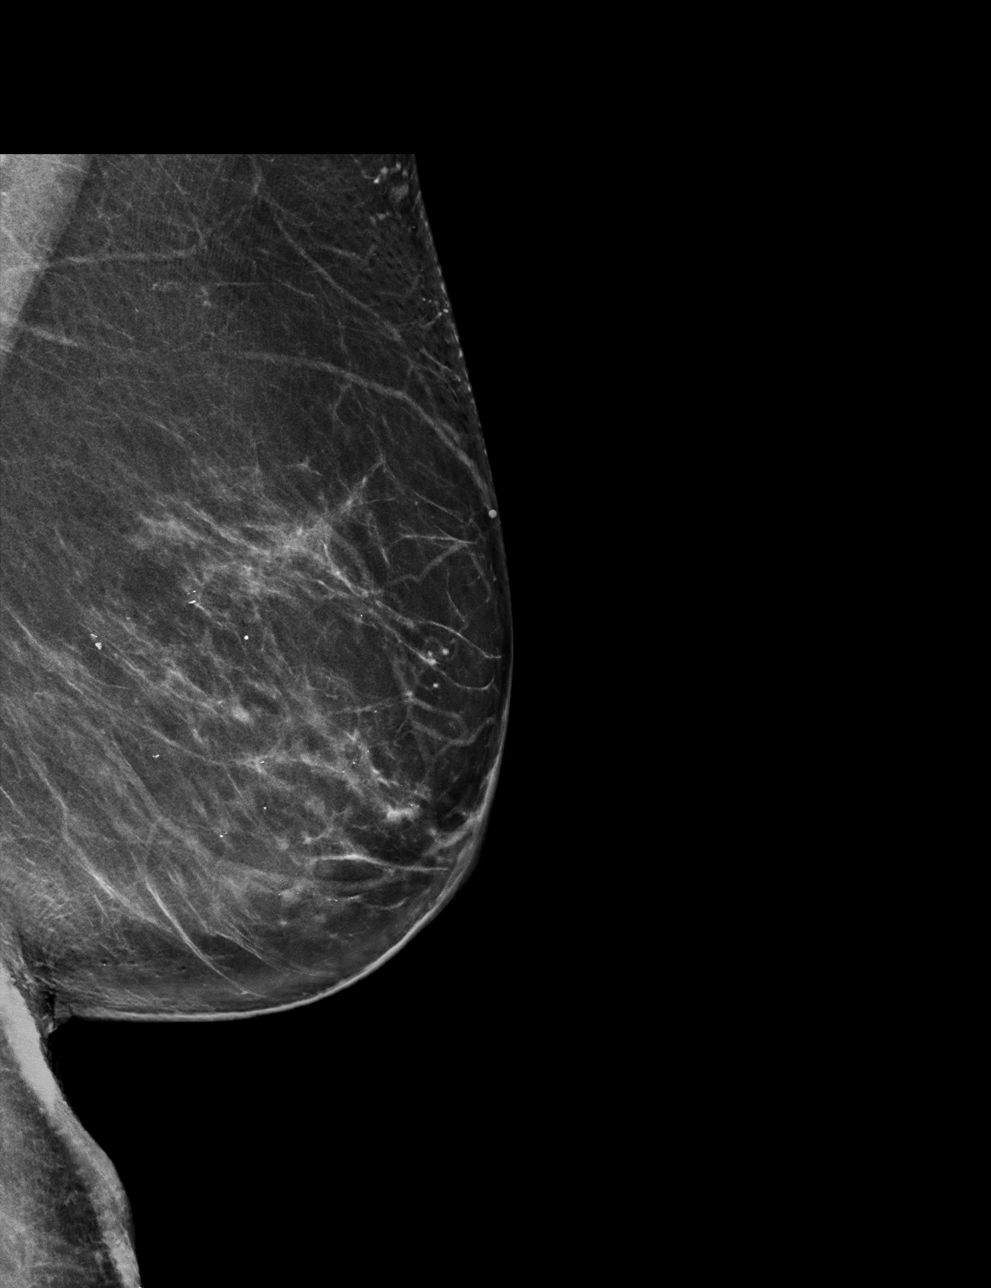

[L ML tomo · tomo slice 37/73.0]
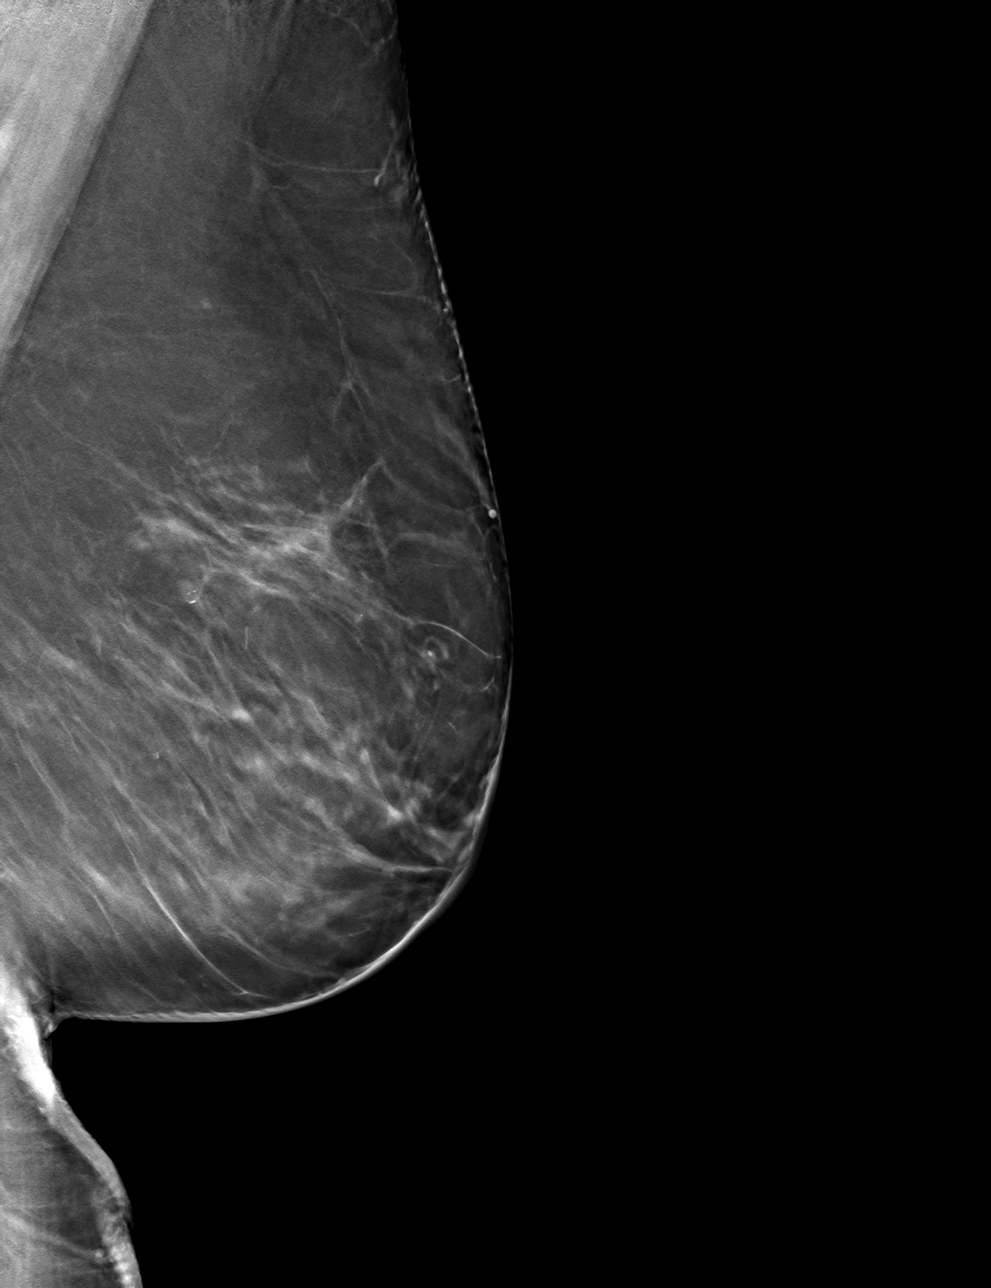

[4 of 8 positions shown; findings below may reference images not displayed]

ACR Breast Density Category b: There are scattered areas of
fibroglandular density.
FINDINGS: Full field and magnification views of the LEFT breast demonstrate a
0.2 cm group of primarily round calcifications within the UPPER
INNER LEFT breast, middle to posterior depth. There is a suggestion
of layering on the LATERAL views.
IMPRESSION: Likely benign 0.2 cm group of UPPER INNER LEFT breast
calcifications. Six-month follow-up recommended to ensure stability.

RECOMMENDATION:
LEFT diagnostic mammogram with magnification views in 6 months.

I have discussed the findings and recommendations with the patient.
If applicable, a reminder letter will be sent to the patient
regarding the next appointment.

BI-RADS CATEGORY  3: Probably benign.

## 2022-02-01 ENCOUNTER — Other Ambulatory Visit: Payer: Self-pay | Admitting: Internal Medicine

## 2022-02-01 ENCOUNTER — Ambulatory Visit
Admission: RE | Admit: 2022-02-01 | Discharge: 2022-02-01 | Disposition: A | Discharge status: Home / Self Care | Payer: Medicare Other | Source: Ambulatory Visit | Attending: Internal Medicine | Admitting: Internal Medicine

## 2022-02-01 DIAGNOSIS — R921 Mammographic calcification found on diagnostic imaging of breast: Secondary | ICD-10-CM

## 2022-04-27 ENCOUNTER — Other Ambulatory Visit: Payer: Self-pay | Admitting: Sports Medicine

## 2022-04-27 DIAGNOSIS — M810 Age-related osteoporosis without current pathological fracture: Secondary | ICD-10-CM

## 2022-09-29 ENCOUNTER — Other Ambulatory Visit: Payer: Self-pay | Admitting: Internal Medicine

## 2022-09-29 DIAGNOSIS — R928 Other abnormal and inconclusive findings on diagnostic imaging of breast: Secondary | ICD-10-CM

## 2022-10-06 ENCOUNTER — Ambulatory Visit
Admission: RE | Admit: 2022-10-06 | Discharge: 2022-10-06 | Disposition: A | Discharge status: Home / Self Care | Payer: Medicare Other | Source: Ambulatory Visit | Attending: Sports Medicine | Admitting: Sports Medicine

## 2022-10-06 DIAGNOSIS — M810 Age-related osteoporosis without current pathological fracture: Secondary | ICD-10-CM

## 2022-10-18 ENCOUNTER — Other Ambulatory Visit: Payer: Medicare Other

## 2022-10-18 ENCOUNTER — Ambulatory Visit
Admission: RE | Admit: 2022-10-18 | Discharge: 2022-10-18 | Disposition: A | Discharge status: Home / Self Care | Payer: Medicare Other | Source: Ambulatory Visit | Attending: Internal Medicine | Admitting: Internal Medicine

## 2022-10-18 DIAGNOSIS — R921 Mammographic calcification found on diagnostic imaging of breast: Secondary | ICD-10-CM

## 2023-10-12 ENCOUNTER — Other Ambulatory Visit: Payer: Self-pay | Admitting: Internal Medicine

## 2023-10-12 DIAGNOSIS — R921 Mammographic calcification found on diagnostic imaging of breast: Secondary | ICD-10-CM

## 2023-11-10 ENCOUNTER — Ambulatory Visit
Admission: RE | Admit: 2023-11-10 | Discharge: 2023-11-10 | Disposition: A | Discharge status: Home / Self Care | Source: Ambulatory Visit | Attending: Internal Medicine | Admitting: Internal Medicine

## 2023-11-10 DIAGNOSIS — R921 Mammographic calcification found on diagnostic imaging of breast: Secondary | ICD-10-CM
